# Patient Record
Sex: Female | Born: 1955 | Race: Black or African American | Hispanic: No | Smoking: Never smoker
Health system: Southern US, Community
[De-identification: ages and names within clinical notes are randomized; demographics above are authoritative.]

## PROBLEM LIST (undated history)

## (undated) DIAGNOSIS — F319 Bipolar disorder, unspecified: Secondary | ICD-10-CM

## (undated) DIAGNOSIS — G4730 Sleep apnea, unspecified: Secondary | ICD-10-CM

## (undated) DIAGNOSIS — T884XXA Failed or difficult intubation, initial encounter: Secondary | ICD-10-CM

## (undated) DIAGNOSIS — Z915 Personal history of self-harm: Secondary | ICD-10-CM

## (undated) DIAGNOSIS — M1990 Unspecified osteoarthritis, unspecified site: Secondary | ICD-10-CM

## (undated) DIAGNOSIS — E079 Disorder of thyroid, unspecified: Secondary | ICD-10-CM

## (undated) DIAGNOSIS — I10 Essential (primary) hypertension: Secondary | ICD-10-CM

## (undated) DIAGNOSIS — E039 Hypothyroidism, unspecified: Secondary | ICD-10-CM

## (undated) DIAGNOSIS — J45909 Unspecified asthma, uncomplicated: Secondary | ICD-10-CM

## (undated) DIAGNOSIS — F329 Major depressive disorder, single episode, unspecified: Secondary | ICD-10-CM

## (undated) DIAGNOSIS — F32A Depression, unspecified: Secondary | ICD-10-CM

## (undated) DIAGNOSIS — D649 Anemia, unspecified: Secondary | ICD-10-CM

## (undated) DIAGNOSIS — E669 Obesity, unspecified: Secondary | ICD-10-CM

## (undated) DIAGNOSIS — R200 Anesthesia of skin: Secondary | ICD-10-CM

## (undated) DIAGNOSIS — F840 Autistic disorder: Secondary | ICD-10-CM

## (undated) DIAGNOSIS — R7303 Prediabetes: Secondary | ICD-10-CM

## (undated) DIAGNOSIS — Z9151 Personal history of suicidal behavior: Secondary | ICD-10-CM

## (undated) DIAGNOSIS — J40 Bronchitis, not specified as acute or chronic: Secondary | ICD-10-CM

## (undated) DIAGNOSIS — F419 Anxiety disorder, unspecified: Secondary | ICD-10-CM

## (undated) DIAGNOSIS — Z9109 Other allergy status, other than to drugs and biological substances: Secondary | ICD-10-CM

## (undated) HISTORY — PX: NECK SURGERY: SHX720

## (undated) HISTORY — PX: THYROID SURGERY: SHX805

## (undated) HISTORY — PX: GALLBLADDER SURGERY: SHX652

## (undated) HISTORY — PX: CARPAL TUNNEL RELEASE: SHX101

## (undated) HISTORY — PX: ABDOMINAL HYSTERECTOMY: SHX81

## (undated) HISTORY — PX: THYROIDECTOMY: SHX17

## (undated) HISTORY — PX: CHOLECYSTECTOMY: SHX55

## (undated) HISTORY — DX: Anxiety disorder, unspecified: F41.9

## (undated) HISTORY — PX: COLONOSCOPY: SHX174

## (undated) HISTORY — PX: OOPHORECTOMY: SHX86

## (undated) HISTORY — DX: Disorder of thyroid, unspecified: E07.9

## (undated) HISTORY — DX: Major depressive disorder, single episode, unspecified: F32.9

## (undated) HISTORY — DX: Depression, unspecified: F32.A

## (undated) SURGERY — Surgical Case
Anesthesia: *Unknown

---

## 2004-02-12 ENCOUNTER — Ambulatory Visit: Payer: Self-pay | Admitting: Unknown Physician Specialty

## 2004-04-13 ENCOUNTER — Ambulatory Visit: Payer: Self-pay | Admitting: Unknown Physician Specialty

## 2004-10-12 ENCOUNTER — Ambulatory Visit: Payer: Self-pay | Admitting: Unknown Physician Specialty

## 2004-11-08 ENCOUNTER — Ambulatory Visit: Payer: Self-pay | Admitting: Unknown Physician Specialty

## 2004-11-21 ENCOUNTER — Ambulatory Visit: Payer: Self-pay | Admitting: Internal Medicine

## 2004-12-06 ENCOUNTER — Emergency Department: Payer: Self-pay | Admitting: Emergency Medicine

## 2004-12-13 ENCOUNTER — Emergency Department: Payer: Self-pay | Admitting: Internal Medicine

## 2005-04-06 ENCOUNTER — Ambulatory Visit: Payer: Self-pay | Admitting: Psychiatry

## 2005-05-04 ENCOUNTER — Emergency Department: Payer: Self-pay | Admitting: Emergency Medicine

## 2005-05-04 ENCOUNTER — Other Ambulatory Visit: Payer: Self-pay

## 2005-05-10 ENCOUNTER — Other Ambulatory Visit: Payer: Self-pay

## 2005-05-10 ENCOUNTER — Emergency Department: Payer: Self-pay | Admitting: Emergency Medicine

## 2005-07-11 ENCOUNTER — Other Ambulatory Visit: Payer: Self-pay

## 2005-07-11 ENCOUNTER — Emergency Department: Payer: Self-pay | Admitting: Emergency Medicine

## 2005-11-03 ENCOUNTER — Ambulatory Visit: Payer: Self-pay | Admitting: Unknown Physician Specialty

## 2006-02-07 ENCOUNTER — Ambulatory Visit: Payer: Self-pay | Admitting: Psychiatry

## 2006-02-07 ENCOUNTER — Other Ambulatory Visit (HOSPITAL_COMMUNITY): Admission: RE | Admit: 2006-02-07 | Discharge: 2006-02-21 | Payer: Self-pay | Admitting: Psychiatry

## 2006-03-13 ENCOUNTER — Emergency Department: Payer: Self-pay | Admitting: Unknown Physician Specialty

## 2006-03-13 ENCOUNTER — Other Ambulatory Visit: Payer: Self-pay

## 2006-04-20 ENCOUNTER — Emergency Department: Payer: Self-pay | Admitting: Emergency Medicine

## 2006-06-20 ENCOUNTER — Emergency Department: Payer: Self-pay | Admitting: Emergency Medicine

## 2006-06-20 ENCOUNTER — Other Ambulatory Visit: Payer: Self-pay

## 2006-06-27 ENCOUNTER — Ambulatory Visit: Payer: Self-pay | Admitting: Unknown Physician Specialty

## 2006-07-04 ENCOUNTER — Other Ambulatory Visit: Payer: Self-pay

## 2006-07-04 ENCOUNTER — Emergency Department: Payer: Self-pay | Admitting: Unknown Physician Specialty

## 2006-07-06 ENCOUNTER — Ambulatory Visit: Payer: Self-pay | Admitting: Otolaryngology

## 2006-07-10 ENCOUNTER — Emergency Department: Payer: Self-pay | Admitting: Emergency Medicine

## 2006-08-08 ENCOUNTER — Ambulatory Visit: Payer: Self-pay | Admitting: Unknown Physician Specialty

## 2007-01-03 ENCOUNTER — Emergency Department: Payer: Self-pay | Admitting: Emergency Medicine

## 2007-01-03 ENCOUNTER — Other Ambulatory Visit: Payer: Self-pay

## 2007-02-06 ENCOUNTER — Ambulatory Visit: Payer: Self-pay | Admitting: Gastroenterology

## 2007-09-05 ENCOUNTER — Ambulatory Visit: Payer: Self-pay | Admitting: Unknown Physician Specialty

## 2007-10-09 ENCOUNTER — Ambulatory Visit: Payer: Self-pay | Admitting: Internal Medicine

## 2007-10-09 ENCOUNTER — Observation Stay (HOSPITAL_COMMUNITY): Admission: EM | Admit: 2007-10-09 | Discharge: 2007-10-10 | Payer: Self-pay | Admitting: Emergency Medicine

## 2007-10-30 ENCOUNTER — Ambulatory Visit: Payer: Self-pay | Admitting: Unknown Physician Specialty

## 2008-02-01 ENCOUNTER — Encounter (INDEPENDENT_AMBULATORY_CARE_PROVIDER_SITE_OTHER): Payer: Self-pay | Admitting: Surgery

## 2008-02-01 ENCOUNTER — Ambulatory Visit (HOSPITAL_COMMUNITY): Admission: RE | Admit: 2008-02-01 | Discharge: 2008-02-02 | Payer: Self-pay | Admitting: Surgery

## 2009-03-02 ENCOUNTER — Ambulatory Visit: Payer: Self-pay | Admitting: Unknown Physician Specialty

## 2009-03-31 ENCOUNTER — Ambulatory Visit: Payer: Self-pay | Admitting: Unknown Physician Specialty

## 2009-06-15 ENCOUNTER — Ambulatory Visit (HOSPITAL_COMMUNITY): Admission: RE | Admit: 2009-06-15 | Discharge: 2009-06-16 | Payer: Self-pay | Admitting: Neurosurgery

## 2009-09-28 ENCOUNTER — Emergency Department: Payer: Self-pay | Admitting: Emergency Medicine

## 2010-03-26 ENCOUNTER — Ambulatory Visit: Payer: Self-pay | Admitting: Unknown Physician Specialty

## 2010-04-12 LAB — BASIC METABOLIC PANEL
BUN: 8 mg/dL (ref 6–23)
CO2: 26 mEq/L (ref 19–32)
Calcium: 9.6 mg/dL (ref 8.4–10.5)
Chloride: 105 mEq/L (ref 96–112)
GFR calc Af Amer: >60 mL/min (ref >60)
GFR calc non Af Amer: >60 mL/min (ref >60)
Potassium: 4.3 mEq/L (ref 3.5–5.1)

## 2010-04-12 LAB — CBC
MCHC: 33.9 g/dL (ref 30.0–36.0)
Platelets: 249 10*3/uL (ref 150–400)
RBC: 4.37 MIL/uL (ref 3.87–5.11)
RDW: 14.2 % (ref 11.5–15.5)

## 2010-04-12 LAB — SURGICAL PCR SCREEN: Staphylococcus aureus: POSITIVE — AB

## 2010-05-10 LAB — DIFFERENTIAL
Basophils Absolute: 0 10*3/uL (ref 0.0–0.1)
Basophils Relative: 0 % (ref 0–1)
Eosinophils Absolute: 0.1 10*3/uL (ref 0.0–0.7)
Monocytes Relative: 4 % (ref 3–12)
Neutrophils Relative %: 82 % — ABNORMAL HIGH (ref 43–77)

## 2010-05-10 LAB — COMPREHENSIVE METABOLIC PANEL
ALT: 17 U/L (ref 0–35)
AST: 23 U/L (ref 0–37)
Albumin: 3.7 g/dL (ref 3.5–5.2)
Alkaline Phosphatase: 52 U/L (ref 39–117)
Chloride: 106 mEq/L (ref 96–112)
GFR calc non Af Amer: 58 mL/min — ABNORMAL LOW (ref >60)
Sodium: 141 mEq/L (ref 135–145)

## 2010-05-10 LAB — URINE MICROSCOPIC-ADD ON

## 2010-05-10 LAB — CBC
HCT: 37.7 % (ref 36.0–46.0)
MCHC: 33.8 g/dL (ref 30.0–36.0)
MCV: 93 fL (ref 78.0–100.0)
Platelets: 240 10*3/uL (ref 150–400)
RBC: 4.06 MIL/uL (ref 3.87–5.11)
RDW: 15.6 % — ABNORMAL HIGH (ref 11.5–15.5)
WBC: 5.4 10*3/uL (ref 4.0–10.5)

## 2010-05-10 LAB — URINALYSIS, ROUTINE W REFLEX MICROSCOPIC
Bilirubin Urine: NEGATIVE
Glucose, UA: NEGATIVE mg/dL
Ketones, ur: NEGATIVE mg/dL
Leukocytes, UA: NEGATIVE
Nitrite: NEGATIVE
Specific Gravity, Urine: 1.006 (ref 1.005–1.030)
Urobilinogen, UA: 0.2 mg/dL (ref 0.0–1.0)

## 2010-05-10 LAB — CALCIUM: Calcium: 9.1 mg/dL (ref 8.4–10.5)

## 2010-05-10 LAB — PROTIME-INR: INR: 0.9 (ref 0.00–1.49)

## 2010-06-08 NOTE — Discharge Summary (Signed)
Marissa Quinn                 ACCOUNT NO.:  [id number]   MEDICAL RECORD NO.:  [id number]          PATIENT TYPE:  OBV   LOCATION:  3705                         FACILITY:  [facility]   PHYSICIAN:  Alvester Morin, [name]  DATE OF BIRTH:  1955-06-09   DATE OF ADMISSION:  10/09/2007  DATE OF DISCHARGE:  10/10/2007                               DISCHARGE SUMMARY   DISCHARGE DIAGNOSES:  1. Chest pain.  2. Shortness of breath.  3. Hypertension.  4. Depression.  5. Thyroid goiter with secondary dysphagia.  6. Obesity.  7. Sleep apnea.  8. Asperger syndrome.  9. Bilateral carpal tunnel syndrome status post bilateral median nerve      decompression 6 years ago.  10.Acute cholecystitis secondary to obstruction status post      cholecystectomy 6 years ago.   ALLERGIES:  TETRACYCLINE and RISPERDAL.   Discharge medications are as follows:  1. Abilify 20 mg 1 tablet by mouth at bedtime.  2. Celexa 60 mg 1 tablet by mouth at bedtime.  3. Hydrochlorothiazide 25 mg 1 tablet by mouth daily.  4. Enalapril 10 mg 1 tablet by mouth daily.   The patient will be discharged home to follow up with primary care  Charlisa Cham, Dr. Silver Huguenin on October 18, 2007 for annual physical  scheduled prior to admission with instructions to follow up on hospital  stay and for continued workup if any for chest pain with dyspnea on  exertion.   Procedures: None performed   Marissa Quinn is a 55 year old African American female who presented to the  emergency room at [facility] with 3-4 days of  intermittent 3/10 substernal chest pain radiating to bilateral arms with  associated dizziness, headache, and shortness of breath.  She states  that the pain increased with exercise and was relieved with rest.  She  denied having any symptoms similar to this previously.  Denied any  tingling, numbness, or weakness in any of her extremities.  She also  complained of some nausea and general malaise, but  denied vomiting  admitted to 1 episode of diarrhea.  She denied diaphoresis, but admit to  feeling flushed with the onset of chest pain.  She had no other  complaints or concerns at this time.   Admitting labs were as follows:  Vital signs; Temperature 98.2, blood  pressure 146/74, heart rate 68, respiratory rate 16, pulse ox 98% on  room air.  Hemoglobin and hematocrit were 13.3 and 39.0 respectively.  Cardiac enzymes obtained in the emergency room showed CK-MB at 1.4, 1.2,  and 1.7.  Troponin remained under 0.05 through the entire series of  enzyme studies.  Basic metabolic panel shows sodium of 138, potassium of  3.6, chloride of 103, CO2 of 26, BUN of 7, creatinine of 1.1, and  glucose of 85.   EKG normal   CXR: peribronchial thickened of bronchitis vs chronic lung disease.   Hospital course was as follows:  1. Chest pain.  The patient was admitted for observation and placed on      telemetry.  Two additional EKGs  were obtained all showing normal      sinus rhythm with no abnormalities over the course of stay.      Cardiac enzymes were repeated and remained within normal limits.      Lipase was normal and TSH was normal.  The patient remained      asymptomatic with pain well controlled with Tylenol p.r.n.      Additionally, she was placed on Crestor 20 mg p.o. daily.  She      subsequently developed some cramping leg pain and her creatine      kinase increased slightly to 210.  At this point, the Crestor was      discontinued and she will not be sent home on any statins.  It was      felt like she could be safely discharged with followup with her      primary Areeb Corron because this was not likely cardiac in etiology.  2. Shortness of breath.  Chest x-ray was obtained while on the      emergency room, which showed some bilateral bronchial and      interstitial prominences.  Radiology called this chronic lung      disease versus bronchitis.  D-dimer was also obtained, which was       mildly elevated at 0.89.  It was not felt that the patient's      symptoms were related to possible pulmonary embolism; however, 40      mg of Lovenox was started and she was placed on DVT prophylaxis.  3. Hypertension.  The patient's blood pressure remained stable      throughout the course of her admission.  4. Depression.  Abilify and Celexa were continued.  5. Thyroid goiter.  TSH was checked and found to be within normal      limits at 0.626.  The patient is scheduled for thyroidectomy in      December 2009.  6. Obesity.  The patient was placed on heart-healthy diet and      discussed the importance of exercise.   The patient's labs on discharge are as follows:  Temperature 98.4,  respirations 18, blood pressure 128/76, O2 saturation 99% on room air,  heart rate 67.  White blood count 7.5, hemoglobin 12.3, hematocrit 37.2,  platelets 318.  Sodium 136, potassium 3.6, chloride 104, CO2 of 23, BUN  8, creatinine 0.76, glucose 74.   Dictation by Nelda Bucks, MS IV for Joaquin Courts, MD.      Joaquin Courts, MD  Electronically Signed      Alvester Morin, [name]  Electronically Signed    VW/MEDQ  D:  10/10/2007  T:  10/11/2007  Job:  [id number]   cc:   Yetta Flock

## 2010-06-08 NOTE — Op Note (Signed)
Marissa Quinn, Marissa Quinn                 ACCOUNT NO.:  [id number]   MEDICAL RECORD NO.:  [id number]          PATIENT TYPE:  AMB   LOCATION:  DAY                          FACILITY:  [facility]   PHYSICIAN:  Velora Heckler, MD      DATE OF BIRTH:  Jan 16, 1956   DATE OF PROCEDURE:  02/01/2008  DATE OF DISCHARGE:                               OPERATIVE REPORT   PREOPERATIVE DIAGNOSIS:  Thyroid goiter with compressive symptoms.   POSTOPERATIVE DIAGNOSIS:  Thyroid goiter with compressive symptoms.   PROCEDURE:  Total thyroidectomy.   SURGEON:  Velora Heckler, MD, FACS   ASSISTANT:  Bertram Savin, MD   ANESTHESIA:  General per Dr. Lestine Box.   ESTIMATED BLOOD LOSS:  Minimal.   PREPARATION:  Betadine.   COMPLICATIONS:  None.   INDICATIONS:  The patient is a 55 year old black female from [place],  [place].  She has a longstanding history of thyroid goiter.  Over  the past 2 years she has had a significant increase in the size of the  left thyroid lobe.  She has developed mild dysphagia.  The patient now  comes to surgery on referral from her endocrinologist for thyroidectomy.   DESCRIPTION OF PROCEDURE:  The procedure is done in OR #1 at the [facility].  The patient is brought to the operating room,  placed in a supine position on the operating room table.  Following  administration of general anesthesia, the patient is positioned and then  prepped and draped in the usual strict aseptic fashion.  After  ascertaining that an adequate level of anesthesia had been achieved, a  Kocher incision is made with a #15 blade.  Dissection is carried through  subcutaneous tissues and platysma.  Hemostasis is obtained with  electrocautery.  Strap muscles are incised in the midline.  The trachea  is slightly deviated to the right.  Dissection is carried down to the  isthmus of the thyroid in the airway.  Subplatysmal flaps are elevated  cephalad and caudad from the thyroid  notch to the sternal notch.  A  Mahorner self-retaining retractor is placed for exposure.  Dissection is  begun on the left side.  Strap muscles are elevated on the left and the  left thyroid lobe is exposed.  The left lobe is gently dissected out  with blunt dissection.  The superior pole vessels are divided between  medium Ligaclips with the Harmonic scalpel.  The gland was mobilized and  rotated anteriorly.  Inferior venous tributaries are divided between  medium Ligaclips with the Harmonic scalpel.  The superior parathyroid  tissue is identified and preserved.  Branches of the inferior thyroid  artery are divided between small Ligaclips with the Harmonic scalpel.  The recurrent laryngeal nerve is identified and preserved.  The inferior  parathyroid gland is identified and preserved on its vascular pedicle.  The gland is mobilized after transection of the ligament of Berry and  the isthmus is mobilized across the midline.  There is no significant  pyramidal lobe.  A dry pack is placed in the  left neck.   Next we turn our attention to the right thyroid lobe.  Again, the strap  muscles are reflected laterally and the right lobe is exposed.  The  superior pole vessels are dissected out and divided between medium  Ligaclips with the Harmonic scalpel.  The gland is rolled further  anteriorly.  The inferior parathyroid gland on the right is identified  on the capsule of the gland.  It is gently dissected off and mobilized  on its vascular pedicle and preserved.  The inferior venous tributaries  are divided between small and medium Ligaclips with the Harmonic  scalpel.  The gland is rotated anteriorly.  Branches of the inferior  thyroid artery are divided between small Ligaclips.  The ligament of  Allyson Sabal is transected with electrocautery and the gland is mobilized up  and onto the anterior trachea.  It is excised off of the trachea.  A  suture is used to mark the left superior pole.  The  entire thyroid gland  is submitted to pathology for review.  The neck is irrigated with warm  saline, which is evacuated.  Good hemostasis is achieved bilaterally.  Surgicel is placed in the operative field.  The strap muscles are  reapproximated in the midline with interrupted 3-0 Vicryl sutures.  The  platysma is closed with interrupted 3-0 Vicryl sutures.  The skin is  closed with a running 4-0 Monocryl subcuticular suture.  The wound is  washed and dried and benzoin and Steri-Strips are applied.  Sterile  dressings are applied.  The patient is awakened from anesthesia and  brought to the recovery room in stable condition.  The patient tolerated  the procedure well.      Velora Heckler, MD  Electronically Signed     TMG/MEDQ  D:  02/01/2008  T:  02/01/2008  Job:  [id number]   cc:   Rockie Neighbours, MD  [place], [place]   Velora Heckler, MD  [phone] [place]  [place] [address]

## 2010-10-25 LAB — CK TOTAL AND CKMB (NOT AT ARMC)
CK, MB: 1.5
CK, MB: 1.7
Relative Index: 0.7
Relative Index: 1.2
Total CK: 140

## 2010-10-25 LAB — DIFFERENTIAL
Basophils Absolute: 0
Basophils Relative: 0
Eosinophils Absolute: 0.3
Eosinophils Relative: 4
Lymphocytes Relative: 4 — ABNORMAL LOW
Neutro Abs: 6.5
Neutrophils Relative %: 86 — ABNORMAL HIGH

## 2010-10-25 LAB — POCT I-STAT, CHEM 8
BUN: 7
Chloride: 103
Glucose, Bld: 85
Hemoglobin: 13.3

## 2010-10-25 LAB — BASIC METABOLIC PANEL
Calcium: 8.6
GFR calc Af Amer: >60
GFR calc non Af Amer: >60
Glucose, Bld: 74
Potassium: 3.6
Sodium: 136

## 2010-10-25 LAB — CBC
HCT: 35.6 — ABNORMAL LOW
Hemoglobin: 11.9 — ABNORMAL LOW
MCHC: 32.9
Platelets: 318
RBC: 3.74 — ABNORMAL LOW
RBC: 3.9
RDW: 13.8
WBC: 6.6
WBC: 7.5

## 2010-10-25 LAB — POCT CARDIAC MARKERS
CKMB, poc: 1.2
Myoglobin, poc: 59.4
Troponin i, poc: <0.05

## 2010-10-25 LAB — LIPID PANEL
Cholesterol: 212 — ABNORMAL HIGH
HDL: 70
Total CHOL/HDL Ratio: 3
VLDL: 9

## 2010-10-25 LAB — COMPREHENSIVE METABOLIC PANEL
AST: 25
Calcium: 9
Chloride: 102
Creatinine, Ser: 0.85
GFR calc non Af Amer: >60

## 2010-10-25 LAB — TROPONIN I: Troponin I: 0.03

## 2011-04-20 ENCOUNTER — Ambulatory Visit: Payer: Self-pay | Admitting: Unknown Physician Specialty

## 2011-07-06 ENCOUNTER — Ambulatory Visit: Payer: Self-pay | Admitting: Unknown Physician Specialty

## 2012-05-22 ENCOUNTER — Ambulatory Visit: Payer: Self-pay | Admitting: Unknown Physician Specialty

## 2012-05-22 DIAGNOSIS — F32A Depression, unspecified: Secondary | ICD-10-CM | POA: Insufficient documentation

## 2012-05-22 DIAGNOSIS — F329 Major depressive disorder, single episode, unspecified: Secondary | ICD-10-CM | POA: Insufficient documentation

## 2012-09-27 ENCOUNTER — Ambulatory Visit: Payer: Self-pay | Admitting: Gastroenterology

## 2013-04-03 ENCOUNTER — Emergency Department: Payer: Self-pay | Admitting: Emergency Medicine

## 2013-04-03 LAB — BASIC METABOLIC PANEL
Anion Gap: 3 — ABNORMAL LOW (ref 7–16)
BUN: 17 mg/dL (ref 7–18)
CHLORIDE: 107 mmol/L (ref 98–107)
CREATININE: 0.98 mg/dL (ref 0.60–1.30)
Calcium, Total: 9.1 mg/dL (ref 8.5–10.1)
Co2: 29 mmol/L (ref 21–32)
EGFR (Non-African Amer.): >60
Glucose: 82 mg/dL (ref 65–99)
OSMOLALITY: 278 (ref 275–301)
Potassium: 3.8 mmol/L (ref 3.5–5.1)
Sodium: 139 mmol/L (ref 136–145)

## 2013-04-03 LAB — CBC
HCT: 39.5 % (ref 35.0–47.0)
HGB: 12.6 g/dL (ref 12.0–16.0)
MCH: 29.5 pg (ref 26.0–34.0)
MCHC: 32 g/dL (ref 32.0–36.0)
MCV: 92 fL (ref 80–100)
Platelet: 288 10*3/uL (ref 150–440)
RBC: 4.28 10*6/uL (ref 3.80–5.20)
RDW: 14 % (ref 11.5–14.5)
WBC: 5.9 10*3/uL (ref 3.6–11.0)

## 2013-04-03 LAB — PRO B NATRIURETIC PEPTIDE: B-TYPE NATIURETIC PEPTID: 9 pg/mL (ref 0–125)

## 2013-04-03 LAB — TROPONIN I

## 2013-07-18 DIAGNOSIS — E039 Hypothyroidism, unspecified: Secondary | ICD-10-CM | POA: Insufficient documentation

## 2013-08-21 ENCOUNTER — Ambulatory Visit: Payer: Self-pay | Admitting: Internal Medicine

## 2013-11-21 DIAGNOSIS — IMO0001 Reserved for inherently not codable concepts without codable children: Secondary | ICD-10-CM | POA: Insufficient documentation

## 2013-11-21 DIAGNOSIS — R6889 Other general symptoms and signs: Secondary | ICD-10-CM | POA: Insufficient documentation

## 2013-11-21 DIAGNOSIS — E668 Other obesity: Secondary | ICD-10-CM | POA: Insufficient documentation

## 2013-11-21 DIAGNOSIS — R200 Anesthesia of skin: Secondary | ICD-10-CM | POA: Insufficient documentation

## 2013-12-02 ENCOUNTER — Ambulatory Visit: Payer: Self-pay | Admitting: Neurology

## 2014-02-26 DIAGNOSIS — I10 Essential (primary) hypertension: Secondary | ICD-10-CM | POA: Insufficient documentation

## 2014-02-26 DIAGNOSIS — M5412 Radiculopathy, cervical region: Secondary | ICD-10-CM | POA: Insufficient documentation

## 2014-04-21 ENCOUNTER — Ambulatory Visit: Payer: Self-pay | Admitting: Dietician

## 2014-05-05 ENCOUNTER — Ambulatory Visit
Admit: 2014-05-05 | Disposition: A | Discharge status: Home / Self Care | Payer: Self-pay | Attending: Psychiatry | Admitting: Psychiatry

## 2014-05-05 LAB — LIPID PANEL
Cholesterol: 207 mg/dL — ABNORMAL HIGH
HDL: 67 mg/dL
LDL CHOLESTEROL, CALC: 126 mg/dL — AB
Triglycerides: 68 mg/dL
VLDL CHOLESTEROL, CALC: 14 mg/dL

## 2014-05-05 LAB — GLUCOSE, RANDOM: GLUCOSE: 96 mg/dL

## 2014-05-14 ENCOUNTER — Ambulatory Visit: Admit: 2014-05-14 | Disposition: A | Discharge status: Home / Self Care | Payer: Self-pay | Admitting: Internal Medicine

## 2014-05-16 DIAGNOSIS — Z8669 Personal history of other diseases of the nervous system and sense organs: Secondary | ICD-10-CM | POA: Insufficient documentation

## 2014-05-16 DIAGNOSIS — G4730 Sleep apnea, unspecified: Secondary | ICD-10-CM | POA: Insufficient documentation

## 2014-05-16 DIAGNOSIS — Z8639 Personal history of other endocrine, nutritional and metabolic disease: Secondary | ICD-10-CM | POA: Insufficient documentation

## 2014-05-16 DIAGNOSIS — Z8679 Personal history of other diseases of the circulatory system: Secondary | ICD-10-CM | POA: Insufficient documentation

## 2014-06-05 DIAGNOSIS — G4733 Obstructive sleep apnea (adult) (pediatric): Secondary | ICD-10-CM | POA: Insufficient documentation

## 2014-06-13 ENCOUNTER — Ambulatory Visit: Payer: Medicare Other | Attending: Neurology

## 2014-06-13 DIAGNOSIS — R0683 Snoring: Secondary | ICD-10-CM | POA: Diagnosis present

## 2014-06-13 DIAGNOSIS — G4733 Obstructive sleep apnea (adult) (pediatric): Secondary | ICD-10-CM | POA: Insufficient documentation

## 2014-06-30 ENCOUNTER — Other Ambulatory Visit: Payer: Self-pay

## 2014-06-30 DIAGNOSIS — F4310 Post-traumatic stress disorder, unspecified: Secondary | ICD-10-CM | POA: Insufficient documentation

## 2014-06-30 DIAGNOSIS — G4700 Insomnia, unspecified: Secondary | ICD-10-CM | POA: Insufficient documentation

## 2014-06-30 DIAGNOSIS — F840 Autistic disorder: Secondary | ICD-10-CM | POA: Insufficient documentation

## 2014-06-30 DIAGNOSIS — F411 Generalized anxiety disorder: Secondary | ICD-10-CM | POA: Insufficient documentation

## 2014-06-30 DIAGNOSIS — F32A Depression, unspecified: Secondary | ICD-10-CM | POA: Insufficient documentation

## 2014-06-30 DIAGNOSIS — T1491XA Suicide attempt, initial encounter: Secondary | ICD-10-CM | POA: Insufficient documentation

## 2014-06-30 DIAGNOSIS — F3342 Major depressive disorder, recurrent, in full remission: Secondary | ICD-10-CM | POA: Insufficient documentation

## 2014-06-30 DIAGNOSIS — F329 Major depressive disorder, single episode, unspecified: Secondary | ICD-10-CM | POA: Insufficient documentation

## 2014-06-30 DIAGNOSIS — F331 Major depressive disorder, recurrent, moderate: Secondary | ICD-10-CM | POA: Insufficient documentation

## 2014-07-01 ENCOUNTER — Other Ambulatory Visit: Payer: Self-pay

## 2014-07-01 ENCOUNTER — Ambulatory Visit (INDEPENDENT_AMBULATORY_CARE_PROVIDER_SITE_OTHER): Payer: Medicare Other | Admitting: Psychiatry

## 2014-07-01 ENCOUNTER — Encounter: Payer: Self-pay | Admitting: Psychiatry

## 2014-07-01 VITALS — BP 118/78 | HR 60 | Temp 97.8°F

## 2014-07-01 DIAGNOSIS — F331 Major depressive disorder, recurrent, moderate: Secondary | ICD-10-CM

## 2014-07-01 DIAGNOSIS — F840 Autistic disorder: Secondary | ICD-10-CM

## 2014-07-01 MED ORDER — CITALOPRAM HYDROBROMIDE 40 MG PO TABS: 40.0000 mg | ORAL_TABLET | Freq: Every day | ORAL | Status: DC

## 2014-07-01 MED ORDER — ARIPIPRAZOLE 15 MG PO TABS: 15.0000 mg | ORAL_TABLET | Freq: Every day | ORAL | Status: DC

## 2014-07-01 NOTE — Progress Notes (Signed)
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 She does not have any complaints today. She continues on her current medications. She states that she started to take the Abilify at night because she thought it might be making her sleepy. However patient is also been diagnosed with sleep apnea and has started using a CPAP machine since her last visit. Patient indicates she feels more rested in the daytime since the use of the CPAP.  She continues to have her part-time job which is daily from the morning until early afternoon. She states she also has been going to the gym trying to lose weight. However she states she has not been successful in losing weight. We reviewed her metabolic labs and they were generally pretty good. Her total cholesterol was slightly elevated at 207, LDL was elevated at 125. Her blood glucose was normal at 96. Chief Complaint:  Chief Complaint    Depression; Other     Visit Diagnosis:  No diagnosis found.  Past Medical History:  Past Medical History  Diagnosis Date  . Anxiety   . Depression   . Thyroid disease     Past Surgical History  Procedure Laterality Date  . Thyroidectomy      total  . Thyroid surgery    . Gallbladder surgery    . Neck surgery     Family History:  Family History  Problem Relation Age of Onset  . Brain cancer Mother   . Hypertension Brother   . Depression Paternal Grandmother   . Dementia Father   . Alcohol abuse Father   . Depression Father   . Depression Daughter    Social History:  History   Social History  . Marital Status: Divorced    Spouse Name: N/A  . Number of Children: N/A  . Years of Education: N/A   Social History Main Topics  . Smoking status: Never Smoker   . Smokeless tobacco: Never Used  . Alcohol Use: No     Comment: Occasionally   . Drug Use:  No  . Sexual Activity: No   Other Topics Concern  . None   Social History Narrative   Additional History:   Assessment:   Musculoskeletal: Strength & Muscle Tone: within normal limits Gait & Station: normal Patient leans: N/A  Psychiatric Specialty Exam: HPI  Review of Systems  Psychiatric/Behavioral: Negative for depression, suicidal ideas, hallucinations, memory loss and substance abuse. The patient is not nervous/anxious and does not have insomnia.     Blood pressure 118/78, pulse 60, temperature 97.8 F (36.6 C), temperature source Tympanic, SpO2 94 %.There is no weight on file to calculate BMI.  General Appearance: Well Groomed  Eye Contact:  Good  Speech:  Clear and Coherent and Slow  Volume:  Normal  Mood:  Good  Affect:  Blunt  Thought Process:  Generally appropriate/linear but slightly impoverished  Orientation:  Full (Time, Place, and Person)  Thought Content:  Negative  Suicidal Thoughts:  No  Homicidal Thoughts:  No  Memory:  Immediate;   Good Recent;   Good Remote;   Good  Judgement:  Good  Insight:  Fair  Psychomotor Activity:  Negative  Concentration:  Fair  Recall:  Good  Fund of Knowledge: Fair  Language: Good  Akathisia:  Negative  Handed:  Right unknown  AIMS (if indicated):  Normal  Assets:  Desire for Improvement  ADL's:  Intact  Cognition: WNL  Sleep:  Five hours per night, uses CPAP   Is the patient at risk to self?  No. Has the patient been a risk to self in the past 6 months?  No. Has the patient been a risk to self within the distant past?  No. Is the patient a risk to others?  No. Has the patient been a risk to others in the past 6 months?  No. Has the patient been a risk to others within the distant past?  No.  Current Medications: Current Outpatient Prescriptions  Medication Sig Dispense Refill  . albuterol (PROVENTIL HFA;VENTOLIN HFA) 108 (90 BASE) MCG/ACT inhaler Inhale into the lungs.    . ARIPiprazole (ABILIFY) 15 MG  tablet Take 1 tablet by mouth daily.    Marland Kitchen aspirin EC 81 MG tablet Take by mouth.    . citalopram (CELEXA) 40 MG tablet Take 1 tablet by mouth daily.    . fluticasone (FLONASE) 50 MCG/ACT nasal spray Place into the nose.    . hydrochlorothiazide (HYDRODIURIL) 12.5 MG tablet Take by mouth.    . levothyroxine (SYNTHROID, LEVOTHROID) 112 MCG tablet Take by mouth daily.    . Multiple Vitamin (MULTI-VITAMINS) TABS Take by mouth.    Marland Kitchen amoxicillin-clavulanate (AUGMENTIN) 875-125 MG per tablet Take 1 tablet by mouth every 12 (twelve) hours.  0  . Cholecalciferol 1000 UNITS tablet Take by mouth.    . gabapentin (NEURONTIN) 100 MG capsule Take by mouth.    . hydrochlorothiazide (HYDRODIURIL) 25 MG tablet Take by mouth.    . levocetirizine (XYZAL) 5 MG tablet Take by mouth.    . pyridOXINE (VITAMIN B-6) 100 MG tablet Take 300 mg by mouth.    . traZODone (DESYREL) 50 MG tablet Take 50 mg by mouth.     No current facility-administered medications for this visit.    Medical Decision Making:  Established Problem, Stable/Improving (1)  Treatment Plan Summary:Medication management Patient has been stable on this medication regimen for some time. We will continue her citalopram at 40 mg daily. She'll continue her Abilify 15 mg daily. Patient will follow up in 3 months. She's been encouraged call if questions or concerns prior to next appointment.  Faith Rogue 07/01/2014, 1:56 PM

## 2014-07-02 ENCOUNTER — Encounter: Payer: Self-pay | Admitting: Licensed Clinical Social Worker

## 2014-07-02 NOTE — Progress Notes (Signed)
### [employer] LCSW Progress Note:    Marissa Quinn 06/05/2014 1:15 PM Location: [place] Patient #: [id number] DOB: [birth year] Divorced / Language: [place] / Race: Black or African American Female    History of Present Illness(Marissa Quinn N Marissa Quinn; 06/11/2014 11:36 AM) The patient is a 59 year old female who presents for a recheck of Depression. The patient describes this as mild and resolved. Note for "Depression": SW discussed case in detail for transition to another provider in the practice. LCSW is availabe for any questions before her last day.  Additional reason for visit:  Recheck of Post traumatic stress disorderis described as the following: The patient describes this as mild.  Note: Start Time: 1:15 p.m. End Time: 2:10 p.m.  Marissa Quinn visited with her daughter and spent time in [place]. Returned Wednesday on [employer]tra[employer]. "I enjoyed it." [place] referring to time spent over Mother's Day with daughter. "I got off the check list." She missed doing her exercise but walked around  On father's birthday which was May 28, 2014, she brought cookies and brownies to the staff at the [facility] where her father died. She's planning to go back on Father's Day. "I spent my daddy's birthday giving back to [facility]." Is planning to return here on Father's Day. Looking forward to the [place] family reunion on June 18.  She has been back on a regular schedule and reports sleeping as good. No changes in her moods. Feels that she is slowly losing weight and is working on her writing around her living with depression, suicide and recovery. LCSW provided her with web resources for writing communities. Enjoys reading as well and finds reading out loud helps her to reflect and understand more of what the content.  Continues to see Marissa Quinn with [facility] for bereavement counseling. Denied  concerns in this area.  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 She recently met with her Peer Support Specialist. Marissa Quinn also is enjoying her private crochett classes. In an effort to meet up with a work out/body building partner, Marissa Quinn created a profile on Meet [email or web address] So far has not had any responses. To get more exercise she is working with a walking group at her [employer].  Will see her PCP today to have results of her Sleep Study. She has a history of being diagnosed with Sleep Apnea but she could not tolerate the CPAP.  Her brother will come in town next week to meet with the attorney since he is the Executor of the Will. He will stay with her yet she stated "I don't think that it will be too stressful."  No reported distress or new stressors and Marissa Quinn continues to feel that she is stable. Commended client on maintaining a daily routine/structure while including activities that she enjoys along with introducing herself to new activities.  Supportive therapy provided along with much reassurance and emotional support. Encouraged ongoing expression of feelings, thoughts, concerns and needs.  PLAN: Continue to monitor client's symptoms and changes. Follow up per client's request in two weeks.    Medication History(Marissa Quinn N Marissa Quinn; 06/05/2014 1:26 PM) Citalopram Hydrobromide (40MG Tablet, 1 (one) Oral daily, Taken starting 05/01/2014) Active. Abilify (15MG Tablet, 1 (one) Oral daily, Taken starting 05/01/2014) Active. Synthroid (112MCG Tablet, Oral daily) Active. Hydrochlorothiazide (25MG Tablet, Oral daily) Active. Aspirin (81MG Tablet, Oral daily) Active. Vitamin D3 (1000UNIT Tablet, Oral daily) Active. Multiple Vitamin (1 (one) Oral daily) Active.    Review of  Systems(Marissa Quinn N Marissa Quinn; 06/05/2014 2:02 PM) Psychiatric:Not Present- Anxiety, Attention Deficit Disorder, Change in Sleep Pattern (Not waking up as much as she did previously.),  Decrease attention, Decrease concentration, Delirium, Delusions, Depression ("I don't think I have any. I think I'm okay so far." "It's not as sunny as I would like."), Frequent crying, Hallucinations, Inability to Concentrate, Insomnia, Memory Loss, Nervousness, Suicidal Ideation and Suicidal Planning.    Assessment & Plan(Marissa Quinn N Marissa Quinn; 06/05/2014 1:32 PM) Recurrent major depression in full remission (296.36  F33.42) Current Plans l Short Term Goal: Patient will be Able to Communicate Needs or Concerns  l Short Term Goal: Will continue using coping strategies that support mood stability and daily functioning.  l Intervention: Individual Psychotherapy  l Interventions: Cognitive Behavioral Therapy   Autistic spectrum disorder (299.00  F84.0) Story: Patient describes functioning well and enjoying life. Thus appears this is stable. Current Plans l Short Term Goal: Identify Positives in their Life  l Intervention: Stress Management  l Interventions: Cognitive Behavioral Therapy  l Intervention: Individual Psychotherapy  l Level of Participation: Interactive  l Patient Strength: Optimistic that Change can Occur  l Patient Strength: Motivated for Treatment  l Patient Strength: Able to Set Goals  l Patient Strength: Managing Daily Responsibilities  l Patient Strength: Average Intellecutal Ability  l Patient Strength: Family involvement or Support  l Patient Strength: History of Previous Success in Treatment  l Patient Strength: Financial Resources Available  l Patient Strength: Verbal  l Patient Strength: Outside Hobbies/Interest  l Patient Strength: Cultural/Spiritual and Community Support/Involvement  l Patient Strength: Stable Housing  l Patient Strength: Employed  l Patient Strength: Aware of Need for Medications  l INDIVIDUAL  PSYCHOTHERAPY FOR 45 TO 50 MINUTES (46219) l Follow up in 2 weeks or as needed    Signed electronically by Marissa Quinn Marissa Quinn (06/11/2014 11:36 AM)
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
## 2014-07-03 ENCOUNTER — Ambulatory Visit (INDEPENDENT_AMBULATORY_CARE_PROVIDER_SITE_OTHER): Payer: Medicare Other | Admitting: Licensed Clinical Social Worker

## 2014-07-03 DIAGNOSIS — F3341 Major depressive disorder, recurrent, in partial remission: Secondary | ICD-10-CM

## 2014-07-03 DIAGNOSIS — F840 Autistic disorder: Secondary | ICD-10-CM

## 2014-07-03 DIAGNOSIS — F4310 Post-traumatic stress disorder, unspecified: Secondary | ICD-10-CM

## 2014-07-03 NOTE — Progress Notes (Signed)
THERAPIST PROGRESS NOTE  Session Time: 1:05 p.m.  Participation Level: Active  Behavioral Response: NeatAlertEuthymic  Type of Therapy: Individual Therapy  Treatment Goals addressed: Coping  Interventions: Solution Focused, Strength-based and Supportive  Summary: Marissa Quinn is a 59 y.o. female who presents with depression, some anxiety related to PTSD symptoms who overall sees herself as making progress.  Her eye contact was good, she seemed a bit slowed down both in speech and psychomotor yet no evidence of tangential thinking.  No reported mania symptoms or agitation.   Sleep has improved with use of over the nose c-pap machine and is adjusting to it.  "I don't feel as sleepy during the day and feeling more rested and more alert than I use to be."  Other forms of self care is joining a gym that she attended over 54 years ago and old members still there and are very supportive.  "It's good Christian atmosphere."  The knitting has been stopped so she could spend more time exercising.  "Everything is going just fine."  "Mood has been good. It's just coming up on my first father's day. I'm looking forward to it. I guess I'm grieving." Visited father's grave last week and the head stone was placed.  Another event is the Family reunion where client always went to take her father. Her daughter is driving up from [facility] and Marissa Quinn may actually ride home to [facility] with daughter a few days.  She talked aout  Only complaint today is recent increase in some allergy symptoms but overall denied additional medical or emotional symptoms.  Work is going good and she really feels supported by co-workers and she enjoys spending time at [employer] and attending Bible Study classes.  Her ongoing goals include losing weight and getting healthy and she is planning to attend Weight Watchers and the other goal is to write a book. "It's coming along in terms of her book."  When the topic becomes too heavy and she feels sad  or depressed but recognizes when she needs to stop this and not keep writing.  She continues to keep a schedule and mood tracker most days yet has given self permission to not become overly obsessive with this.  She did not bring this to therapy today as she did not feel the need to do so.  Maleeah denied additional or new concerns related to therapy.  Given her progress and recent medication follow up with last week with Dr. Jimmye Norman, Ms.Laurance Flatten and LCSW discussed future needs/topics for therapy.  She appeared well an described self as feeling confident in terms of coping strategies available to her.  Decision per client to return to OPT PRN because she is feeling much more stable.   Suicidal/Homicidal: Negativewithout intent/plan  Therapist Response:    Offered ongoing emotional and social support to continue to build trust and rapport.  Gently reinforced client's available strengths and resiliency factors and commended for keeping self busy with activities that she is passionate about, addressing her     spiritual needs through [place] and bibe study, using exercise to increase health and staying connected with people that who are positive, supportive and encouraging.  Gently reminded to continue strategies that will allow her to recognize any patterns in     thoughts or situations that precipitate a change in mood.  Commended Marissa Quinn on her dedication/motivation to use effective boundary setting and coping skills to promote ongoing mental health stability.  Plan: Return again in 3 months or  PRN  Diagnosis: Major Depressive Disorder, Recurrent, In Partial Remission   PTSD   Autism  Miguel Dibble, LCSW 07/03/2014

## 2014-10-01 ENCOUNTER — Ambulatory Visit (INDEPENDENT_AMBULATORY_CARE_PROVIDER_SITE_OTHER): Payer: Medicare Other | Admitting: Psychiatry

## 2014-10-01 ENCOUNTER — Encounter: Payer: Self-pay | Admitting: Psychiatry

## 2014-10-01 VITALS — BP 118/78 | HR 72 | Temp 98.0°F | Ht 64.0 in | Wt 258.4 lb

## 2014-10-01 DIAGNOSIS — F331 Major depressive disorder, recurrent, moderate: Secondary | ICD-10-CM | POA: Diagnosis not present

## 2014-10-01 MED ORDER — CITALOPRAM HYDROBROMIDE 40 MG PO TABS: 40.0000 mg | ORAL_TABLET | Freq: Every day | ORAL | Status: DC

## 2014-10-01 MED ORDER — BUPROPION HCL ER (XL) 150 MG PO TB24: 150.0000 mg | ORAL_TABLET | Freq: Every day | ORAL | Status: DC

## 2014-10-01 MED ORDER — ARIPIPRAZOLE 15 MG PO TABS: 15.0000 mg | ORAL_TABLET | Freq: Every day | ORAL | Status: DC

## 2014-10-01 NOTE — Progress Notes (Signed)
BH MD/PA/NP OP Progress Note  10/01/2014 2:07 PM Marissa Quinn  MRN:  [id number]  Subjective:  Patient returns for follow-up for major depressive disorder, moderate, recurrent in autistic spectrum disorder. Today she does discuss that when the fall arrives her mood becomes more depressed. She states that this occurs because she has her birthday in September and it makes her think about living another year. She states she does not have any suicidal thoughts but sometimes does think about making another year and dealing with her depression. However she states she does want to continue to live for her daughter who is in medical school and be able to support her. She also states that her father passed away in 12-28-22 and thus as we get close to the holidays she feels like she will be sad because she will have to go through this time without him. He was a comforting source for her. It's her appetite is good and she is sleeping fairly well.  His cussed this and she reports her mood is somewhat more depressed in the fall that maybe we can initiate some Wellbutrin to augment her Celexa. Patient is agreeable to this plan. She continues to work and her job but does report some decreased energy at times. Chief Complaint: As the days get shorter, my mood Chief Complaint    Follow-up; Medication Refill     Visit Diagnosis:     ICD-9-CM ICD-10-CM   1. Major depressive disorder, recurrent episode, moderate 296.32 F33.1 ARIPiprazole (ABILIFY) 15 MG tablet     citalopram (CELEXA) 40 MG tablet    Past Medical History:  Past Medical History  Diagnosis Date  . Anxiety   . Depression   . Thyroid disease     Past Surgical History  Procedure Laterality Date  . Thyroidectomy      total  . Thyroid surgery    . Gallbladder surgery    . Neck surgery     Family History:  Family History  Problem Relation Age of Onset  . Brain cancer Mother   . Hypertension Brother   . Depression Paternal Grandmother   .  Dementia Father   . Alcohol abuse Father   . Depression Father   . Depression Daughter    Social History:  Social History   Social History  . Marital Status: Divorced    Spouse Name: N/A  . Number of Children: N/A  . Years of Education: N/A   Social History Main Topics  . Smoking status: Never Smoker   . Smokeless tobacco: Never Used  . Alcohol Use: No     Comment: Occasionally   . Drug Use: No  . Sexual Activity: No   Other Topics Concern  . None   Social History Narrative   Additional History:   Assessment:   Musculoskeletal: Strength & Muscle Tone: within normal limits Gait & Station: normal Patient leans: N/A  Psychiatric Specialty Exam: HPI  Review of Systems  Psychiatric/Behavioral: Positive for depression. Negative for suicidal ideas, hallucinations, memory loss and substance abuse. The patient is not nervous/anxious and does not have insomnia.     Blood pressure 118/78, pulse 72, temperature 98 F (36.7 C), temperature source Tympanic, height 5\' 4"  (1.626 m), weight 258 lb 6.4 oz (117.209 kg), SpO2 87 %.Body mass index is 44.33 kg/(m^2).  General Appearance: Well Groomed  Eye Contact:  Good  Speech:  Clear and Coherent and Slow  Volume:  Normal  Mood:  Good  Affect:  Blunt  Thought Process:  Generally appropriate/linear but slightly impoverished  Orientation:  Full (Time, Place, and Person)  Thought Content:  Negative  Suicidal Thoughts:  No  Homicidal Thoughts:  No  Memory:  Immediate;   Good Recent;   Good Remote;   Good  Judgement:  Good  Insight:  Fair  Psychomotor Activity:  Negative  Concentration:  Fair  Recall:  Good  Fund of Knowledge: Fair  Language: Good  Akathisia:  Negative  Handed:  Right unknown  AIMS (if indicated):  Normal  Assets:  Desire for Improvement  ADL's:  Intact  Cognition: WNL  Sleep:  Five hours per night, uses CPAP   Is the patient at risk to self?  No. Has the patient been a risk to self in the past 6  months?  No. Has the patient been a risk to self within the distant past?  No. Is the patient a risk to others?  No. Has the patient been a risk to others in the past 6 months?  No. Has the patient been a risk to others within the distant past?  No.  Current Medications: Current Outpatient Prescriptions  Medication Sig Dispense Refill  . albuterol (PROVENTIL HFA;VENTOLIN HFA) 108 (90 BASE) MCG/ACT inhaler Inhale into the lungs.    Marland Kitchen amoxicillin-clavulanate (AUGMENTIN) 875-125 MG per tablet Take 1 tablet by mouth every 12 (twelve) hours.  0  . ARIPiprazole (ABILIFY) 15 MG tablet Take 1 tablet (15 mg total) by mouth daily. 30 tablet 3  . aspirin EC 81 MG tablet Take by mouth.    . Cholecalciferol 1000 UNITS tablet Take by mouth.    . citalopram (CELEXA) 40 MG tablet Take 1 tablet (40 mg total) by mouth daily. 30 tablet 3  . fluticasone (FLONASE) 50 MCG/ACT nasal spray Place into the nose.    . gabapentin (NEURONTIN) 100 MG capsule Take by mouth.    . hydrochlorothiazide (HYDRODIURIL) 12.5 MG tablet Take by mouth.    . levocetirizine (XYZAL) 5 MG tablet Take by mouth.    . levothyroxine (SYNTHROID, LEVOTHROID) 112 MCG tablet Take by mouth daily.    . Multiple Vitamin (MULTI-VITAMINS) TABS Take by mouth.    . pyridOXINE (VITAMIN B-6) 100 MG tablet Take 300 mg by mouth.    . traZODone (DESYREL) 50 MG tablet Take 50 mg by mouth.    Marland Kitchen buPROPion (WELLBUTRIN XL) 150 MG 24 hr tablet Take 1 tablet (150 mg total) by mouth daily. 30 tablet 1  . hydrochlorothiazide (HYDRODIURIL) 25 MG tablet Take by mouth.     No current facility-administered medications for this visit.    Medical Decision Making:  Established Problem, Stable/Improving (1)  Treatment Plan Summary:Medication management Patient has been stable on this medication regimen for some time. We will continue her citalopram at 40 mg daily. She'll continue her Abilify 15 mg daily. We will start Wellbutrin XL 150 mg in the morning. Risk and  benefits of been discussing patient's able consent. She'll follow up in 1 month. She's been encouraged call if questions or concerns prior to next appointment.  Faith Rogue 10/01/2014, 2:07 PM

## 2014-10-02 ENCOUNTER — Ambulatory Visit (INDEPENDENT_AMBULATORY_CARE_PROVIDER_SITE_OTHER): Payer: Medicare Other | Admitting: Licensed Clinical Social Worker

## 2014-10-02 DIAGNOSIS — F331 Major depressive disorder, recurrent, moderate: Secondary | ICD-10-CM

## 2014-10-02 DIAGNOSIS — F840 Autistic disorder: Secondary | ICD-10-CM

## 2014-10-02 DIAGNOSIS — F4321 Adjustment disorder with depressed mood: Secondary | ICD-10-CM

## 2014-10-02 NOTE — Progress Notes (Signed)
THERAPIST PROGRESS NOTE  Session Time: 1:03 p.m. - 2:00 p.m.  Participation Level: Active  Behavioral Response: CasualAlertAnxious and Depressed  Type of Therapy: Individual Therapy  Treatment Goals addressed: Coping  Interventions: Solution Focused, Strength-based, Supportive and Other: Illness management and relapse prevention  Summary: Marissa Quinn is a 59 y.o. female who presents with a recurrence of depressive symptoms as evidenced by recent episodes of feeling more down and sad, hopeless, disinterested, low motivation and poor energy.  Last OPT with client was in June 2016, at which time her affect was brighter and mood was not described as depressed.   Marissa Quinn returns to OPT to discuss illness management and gain additional coping skills.  "My mood gets down the closer I get to my birthday. I get depressed and I think it's because I've got another year of living."  Client is practicing gratitude in the midst of experiencing an increasing sense of hopelessness about her life as she lives with the chronic nature of her depression.  She is not regularly attending the gym or Church as she had been and also indicates that she does not want to talk to people as much as she usually does and has not been calling or returning calls/messages left.  Historically client had several people in her family that she speaks with daily and over past few weeks she indicated that she has not initiated calls or answered their calls.     On a positive note she will visit daughter in [place] this week-end and Marissa Quinn also visited sister and family in [place] to attend family members [employer] graduation.  Financial stress was discussed with Marissa Quinn considering her options to pay down/pay off credit cards that she has now maxed out in order to pay daughter's bills.  Family strain since client's daughter loaned client's brother a significant amount of money and since he has not paid the daughter back, Marissa Quinn has had to step in  to assist daughter with expenses for medical school and living needs.  Ironically, she is collecting rent money for her brother since he inherited a rental property from their father.  "I'm not comfortable having this money in my house."  Marissa Quinn admitted to LCSW that she has thought that the brother could use the rent money to pay back Marissa Quinn's daughter.  Overall, the situation has not been settled.  Another stressor is that the Marissa Quinn of her father has not been settled and she is often having to speak with attorneys and other people about father's assets.  At this time she is undecided about what type of ritual or remembrance she will do for her father's anniversary date or how the Holidays will be.  She seems excited that her brother will spend Christmas with she and daughter.  No health problems and blood pressure has been stable.  "I need to get back to exercising."  She has been doing more work at her job on the week-ends and finds comfort in this. 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  She indicated that she is hopeful that the light box type of item she has which is actually like a sun visor or eye glasses will help with her moods and that she will be more motivated to attend the gym and keep going to [place].  Marissa Quinn  is realistic that by her not doing as many of her previous activities that she is socially isolating herself.  Client does try to stay pro-active with her care and emotional health and asked LCSW about local support groups for Seasonal Affective Disorder.  She was very receptive to information provided to her today about support groups and strategies for increasing motivation and resiliency when depressed. Marissa Quinn also agreed to try use of technology/apps or with her own paper tracking system to track her moods  along with possible triggers/causes.  Suicidal/Homicidal: Negativewithout intent/plan.  She does endorse fleeting passive thoughts of death yet adamantly denied intent or plan to harm herself citing "I would not do that to my daughter."  Therapist Response:    Offered ongoing emotional and social support to continue building trust and rapport.  Gently reinforced client's available strengths and resiliency factors and commended for talking openly about her concerns related to mental health issues, symptoms and worries about relapse. Gently reminded to identify those strategies that allow her to recognize any patterns in thoughts or situations that precipitate an increase in depressive symptoms.  Provided two different articles: one on building motivation when depressed and the other about warning signs of relapse and learning how to keep triggers from leading to relapse.  Highlighted client's creativity when it comes to designing forms/ways of documenting important pieces of information/data and urged that she consider how this creativity can be tied into her strategies for coping with and preventing symptoms.  Validated concerns voiced while empowering client that she has the ability/skills to develop additional ways of managing her illness.  LCSW voiced a commitment to assisting Marissa Quinn to explore additional triggers for her depression/under-lying causes and to decide on new coping strategies.  Informed client about [facility] support/educational groups and suggested that she call to find out if there is a group that may interest her.  Plan: Return again in 3 weeks and client will keep all medical and psychiatric/counseling appointments.  Marissa Quinn will take medications as prescribed and report any adverse effects.  Client will take medications as prescribed.  Diagnosis: Major Depressive Disorder, Recurrent, Moderate   Autism   PTSD   Grief  Marissa Dibble, LCSW 10/02/2014

## 2014-10-16 ENCOUNTER — Ambulatory Visit: Payer: Self-pay | Admitting: Licensed Clinical Social Worker

## 2014-10-20 ENCOUNTER — Ambulatory Visit: Payer: Self-pay | Admitting: Licensed Clinical Social Worker

## 2014-10-30 ENCOUNTER — Ambulatory Visit (INDEPENDENT_AMBULATORY_CARE_PROVIDER_SITE_OTHER): Payer: Medicare Other | Admitting: Licensed Clinical Social Worker

## 2014-10-30 DIAGNOSIS — F331 Major depressive disorder, recurrent, moderate: Secondary | ICD-10-CM | POA: Diagnosis not present

## 2014-10-30 DIAGNOSIS — F840 Autistic disorder: Secondary | ICD-10-CM | POA: Diagnosis not present

## 2014-10-30 DIAGNOSIS — F4310 Post-traumatic stress disorder, unspecified: Secondary | ICD-10-CM | POA: Diagnosis not present
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
## 2014-10-31 ENCOUNTER — Ambulatory Visit (INDEPENDENT_AMBULATORY_CARE_PROVIDER_SITE_OTHER): Payer: Medicare Other | Admitting: Psychiatry

## 2014-10-31 ENCOUNTER — Encounter: Payer: Self-pay | Admitting: Psychiatry

## 2014-10-31 VITALS — BP 138/84 | HR 78 | Temp 97.7°F | Ht 64.5 in | Wt 263.6 lb

## 2014-10-31 DIAGNOSIS — F840 Autistic disorder: Secondary | ICD-10-CM | POA: Diagnosis not present

## 2014-10-31 DIAGNOSIS — F331 Major depressive disorder, recurrent, moderate: Secondary | ICD-10-CM

## 2014-10-31 DIAGNOSIS — F4321 Adjustment disorder with depressed mood: Secondary | ICD-10-CM

## 2014-10-31 MED ORDER — BUPROPION HCL ER (XL) 150 MG PO TB24: 150.0000 mg | ORAL_TABLET | Freq: Every day | ORAL | Status: DC

## 2014-10-31 NOTE — Progress Notes (Signed)
BH MD/PA/NP OP Progress Note  10/31/2014 2:16 PM Marissa Quinn  MRN:  [id number]  Subjective:  Patient returns for follow-up for major depressive disorder, moderate, recurrent in autistic spectrum disorder. Patient feels that the added Wellbutrin at the last visit has helped her mood. She stated that she tends to have some worsening depression at this time of the year because it is the same time of the year as when her father passed away in 01-20-23 and also her birthday is in September. She states that when she has her birthday it makes her think about she has lived another year without her father. She states she feels good and that she is sleeping well and eating well. Chief Complaint: better Chief Complaint    Follow-up; Medication Refill     Visit Diagnosis:     ICD-9-CM ICD-10-CM   1. Major depressive disorder, recurrent episode, moderate (HCC) 296.32 F33.1   2. Autism 299.00 F84.0   3. Grief 309.0 F43.21     Past Medical History:  Past Medical History  Diagnosis Date  . Anxiety   . Depression   . Thyroid disease     Past Surgical History  Procedure Laterality Date  . Thyroidectomy      total  . Thyroid surgery    . Gallbladder surgery    . Neck surgery     Family History:  Family History  Problem Relation Age of Onset  . Brain cancer Mother   . Hypertension Brother   . Depression Paternal Grandmother   . Dementia Father   . Alcohol abuse Father   . Depression Father   . Depression Daughter    Social History:  Social History   Social History  . Marital Status: Divorced    Spouse Name: N/A  . Number of Children: N/A  . Years of Education: N/A   Social History Main Topics  . Smoking status: Never Smoker   . Smokeless tobacco: Never Used  . Alcohol Use: No     Comment: Occasionally   . Drug Use: No  . Sexual Activity: No   Other Topics Concern  . None   Social History Narrative   Additional History:   Assessment:   Musculoskeletal: Strength &  Muscle Tone: within normal limits Gait & Station: normal Patient leans: N/A  Psychiatric Specialty Exam: HPI  Review of Systems  Psychiatric/Behavioral: Negative for depression, suicidal ideas, hallucinations, memory loss and substance abuse. The patient is not nervous/anxious and does not have insomnia.   All other systems reviewed and are negative.   Blood pressure 138/84, pulse 78, temperature 97.7 F (36.5 C), temperature source Tympanic, height 5' 4.5" (1.638 m), weight 263 lb 9.6 oz (119.568 kg), SpO2 93 %.Body mass index is 44.56 kg/(m^2).  General Appearance: Well Groomed  Eye Contact:  Good  Speech:  Clear and Coherent and Slow  Volume:  Normal  Mood:  Good  Affect:  Blunt  Thought Process:  Generally appropriate/linear but slightly impoverished  Orientation:  Full (Time, Place, and Person)  Thought Content:  Negative  Suicidal Thoughts:  No  Homicidal Thoughts:  No  Memory:  Immediate;   Good Recent;   Good Remote;   Good  Judgement:  Good  Insight:  Fair  Psychomotor Activity:  Negative  Concentration:  Fair  Recall:  Good  Fund of Knowledge: Fair  Language: Good  Akathisia:  Negative  Handed:  Right unknown  AIMS (if indicated):  Normal  Assets:  Desire for Improvement  ADL's:  Intact  Cognition: WNL  Sleep:  Five hours per night, uses CPAP   Is the patient at risk to self?  No. Has the patient been a risk to self in the past 6 months?  No. Has the patient been a risk to self within the distant past?  No. Is the patient a risk to others?  No. Has the patient been a risk to others in the past 6 months?  No. Has the patient been a risk to others within the distant past?  No.  Current Medications: Current Outpatient Prescriptions  Medication Sig Dispense Refill  . albuterol (PROVENTIL HFA;VENTOLIN HFA) 108 (90 BASE) MCG/ACT inhaler Inhale into the lungs.    Marland Kitchen amoxicillin-clavulanate (AUGMENTIN) 875-125 MG per tablet Take 1 tablet by mouth every 12 (twelve)  hours.  0  . ARIPiprazole (ABILIFY) 15 MG tablet Take 1 tablet (15 mg total) by mouth daily. 30 tablet 3  . aspirin EC 81 MG tablet Take by mouth.    Marland Kitchen buPROPion (WELLBUTRIN XL) 150 MG 24 hr tablet Take 1 tablet (150 mg total) by mouth daily. 30 tablet 2  . Cholecalciferol 1000 UNITS tablet Take by mouth.    . citalopram (CELEXA) 40 MG tablet Take 1 tablet (40 mg total) by mouth daily. 30 tablet 3  . fluticasone (FLONASE) 50 MCG/ACT nasal spray Place into the nose.    . hydrochlorothiazide (HYDRODIURIL) 12.5 MG tablet Take by mouth.    . levocetirizine (XYZAL) 5 MG tablet Take by mouth.    . levothyroxine (SYNTHROID, LEVOTHROID) 112 MCG tablet Take by mouth daily.    . Multiple Vitamin (MULTI-VITAMINS) TABS Take by mouth.    . pyridOXINE (VITAMIN B-6) 100 MG tablet Take 300 mg by mouth.    . traZODone (DESYREL) 50 MG tablet Take 50 mg by mouth.     No current facility-administered medications for this visit.    Medical Decision Making:  Established Problem, Stable/Improving (1)  Treatment Plan Summary:Medication management   Major depressive disorder, moderate, recurrent  Patient reports improvement in her depressed mood.. We will continue her citalopram at 40 mg daily. She'll continue her Abilify 15 mg daily. We will continue the Wellbutrin XL 150 mg in the morning.  Autistic spectrum disorder-stable  Patient will follow up in 2 months. She's been encouraged call any questions or concerns prior to her next appointment. Faith Rogue 10/31/2014, 2:16 PM

## 2014-11-18 ENCOUNTER — Ambulatory Visit (INDEPENDENT_AMBULATORY_CARE_PROVIDER_SITE_OTHER): Payer: Medicare Other | Admitting: Licensed Clinical Social Worker

## 2014-11-18 DIAGNOSIS — F840 Autistic disorder: Secondary | ICD-10-CM | POA: Diagnosis not present

## 2014-11-18 DIAGNOSIS — F4321 Adjustment disorder with depressed mood: Secondary | ICD-10-CM

## 2014-11-18 DIAGNOSIS — F331 Major depressive disorder, recurrent, moderate: Secondary | ICD-10-CM | POA: Diagnosis not present

## 2014-11-18 NOTE — Progress Notes (Signed)
THERAPIST PROGRESS NOTE  Session Time:  3:15 p.m. -  4:15 p.m.  Participation Level: Active  Behavioral Response: CasualAlertEuphoric  Type of Therapy: Individual Therapy  Treatment Goals addressed: Anxiety and Coping  Interventions: Solution Focused, Supportive and Reframing  Summary: Marissa Quinn is a 59 y.o. female who presents to OPT for a follow up appointment to address her symptoms and coping. "I've been feeling okay." Reports that the Wellbutrin is helping her. "I feel like I want to wrap up in a blanket and sleep but it's the weather."  Client usually up around 4:30 a.m. and turns her lights out at 11 or 11:30 p.m. Uses CPAP machine. [name] states that she feels better with only 4-5 hours of sleep and anything over that causes her to feel sluggish.  She is using her light sunglasses and in combination with Wellbutrin she believes her mood is stable.  "This is my best change of season so far." Denied feeling sad or depressed and stated that this was the first birthday that she has had where "I was actually happy to celebrate and did not have the dread that I had lived another year with the depression."  She has not returned to the gym because she has been painting furniture and has a visit planned next week, 11/25/14, to see her daughter then her daughter, Marissa Quinn will travel to see client for Thanksgiving Holiday.   [name] voiced concerns that if her coping mechanisms stop working then her depression may become worse.  Red flags that she identified are: being real negative about herself, losing interest in spending times with friends, losing interests in things, feeling helpless and hopeless and cancelling appointments.  Overall her outlook was optimistic and hopeful and she is not experiencing SI or changes in her moods or behaviors that would indicate a relapse.  Client was receptive to meeting other LCSW in the practice, Nolon Rod, and agreed with transition from this LCSW to  Ms. Peacock.  Eventually, Alvis expressed an interest to resume OPT with this LCSW after insurance contracting is secured at therapist's new practice location.  [name] indicated "I enjoy talking to you. I like it that you can bring things out of me that I was not able to see."  No immediate concerns or risk factors were voiced by client or assessed by LCSW.  Nolon Rod met client and initial OPT appointment was scheduled for client to see Joni Reining.  Suicidal/Homicidal: Negativewithout intent/plan  Therapist Response:   Offered ongoing emotional and social support to validate Marissa Quinn's experiences and emotional reaction to these.  Assessed client's functional status and for any decline in symptoms/moods/behaviors.  Gently reinforced client's available strengths and resiliency factors and commended for keeping self busy with activities that are meaningful to her.  Provided an overview of client's options for ongoing OPT and introduced client to [facility] [place].  Wished client well with the changing of seasons and encouraged her to notify Dr. Mayford Knife immediately if she begins to experience an increase in symptoms or side effects with medications.   Plan: Return again PRN.  Terminated therapeutic services with client who will continue OPT at [facility] with Nolon Rod, LCSW, in the interim until she can resume OPT with this LCSW.  Marissa Quinn will remain compliant with her medications and appointments and notify [facility] staff of any complications with medications or changes in her symptoms, moods, behaviors, etc.   Diagnosis: Major Depressive Disorder, Recurrent, In Partial Remission   PTSD   Autism  Miguel Dibble, LCSW 11/18/2014

## 2014-12-10 ENCOUNTER — Other Ambulatory Visit: Payer: Self-pay | Admitting: Internal Medicine

## 2014-12-10 DIAGNOSIS — Z1239 Encounter for other screening for malignant neoplasm of breast: Secondary | ICD-10-CM

## 2014-12-24 ENCOUNTER — Ambulatory Visit: Payer: Medicare Other

## 2014-12-24 ENCOUNTER — Other Ambulatory Visit: Payer: Self-pay | Admitting: Internal Medicine

## 2014-12-24 ENCOUNTER — Ambulatory Visit
Admission: RE | Admit: 2014-12-24 | Discharge: 2014-12-24 | Disposition: A | Discharge status: Home / Self Care | Payer: Medicare Other | Source: Ambulatory Visit | Attending: Internal Medicine | Admitting: Internal Medicine

## 2014-12-24 DIAGNOSIS — Z1239 Encounter for other screening for malignant neoplasm of breast: Secondary | ICD-10-CM

## 2014-12-24 DIAGNOSIS — Z1231 Encounter for screening mammogram for malignant neoplasm of breast: Secondary | ICD-10-CM | POA: Insufficient documentation

## 2014-12-31 ENCOUNTER — Ambulatory Visit: Payer: Self-pay | Admitting: Psychiatry

## 2015-01-09 ENCOUNTER — Ambulatory Visit (INDEPENDENT_AMBULATORY_CARE_PROVIDER_SITE_OTHER): Payer: Medicare Other | Admitting: Psychiatry

## 2015-01-09 ENCOUNTER — Encounter: Payer: Self-pay | Admitting: Psychiatry

## 2015-01-09 VITALS — BP 122/84 | HR 100 | Temp 97.7°F | Ht 64.5 in | Wt 268.0 lb

## 2015-01-09 DIAGNOSIS — F331 Major depressive disorder, recurrent, moderate: Secondary | ICD-10-CM

## 2015-01-09 DIAGNOSIS — N950 Postmenopausal bleeding: Secondary | ICD-10-CM | POA: Insufficient documentation

## 2015-01-09 MED ORDER — ARIPIPRAZOLE 15 MG PO TABS: 15.0000 mg | ORAL_TABLET | Freq: Every day | ORAL | Status: DC

## 2015-01-09 MED ORDER — BUPROPION HCL ER (XL) 150 MG PO TB24: 150.0000 mg | ORAL_TABLET | Freq: Every day | ORAL | Status: DC

## 2015-01-09 MED ORDER — CITALOPRAM HYDROBROMIDE 40 MG PO TABS: 40.0000 mg | ORAL_TABLET | Freq: Every day | ORAL | Status: DC

## 2015-01-09 NOTE — Progress Notes (Signed)
BH MD/PA/NP OP Progress Note  01/09/2015 2:43 PM Marissa Quinn  MRN:  [id number]  Subjective:  Patient returns for follow-up for major depressive disorder, moderate, recurrent in autistic spectrum disorder. She indicates her mood is been good. She states she that this is a good thing given that it's the holidays and typically she is not in a good mood during the holidays. She states that she has a brother that is going to come visit her. She states she also is going to volunteer to distribute Goody bags at a [employer]. She states that her father resided at the long-term facility at the [facility] prior to his death. States she's sleeping well and eating well. She still feels that the Wellbutrin has been very helpful in terms of helping her depressed mood. Chief Complaint: better Chief Complaint    Follow-up; Medication Refill     Visit Diagnosis:     ICD-9-CM ICD-10-CM   1. Major depressive disorder, recurrent episode, moderate (HCC) 296.32 F33.1 ARIPiprazole (ABILIFY) 15 MG tablet     citalopram (CELEXA) 40 MG tablet    Past Medical History:  Past Medical History  Diagnosis Date  . Anxiety   . Depression   . Thyroid disease     Past Surgical History  Procedure Laterality Date  . Thyroidectomy      total  . Thyroid surgery    . Gallbladder surgery    . Neck surgery     Family History:  Family History  Problem Relation Age of Onset  . Brain cancer Mother   . Hypertension Brother   . Depression Paternal Grandmother   . Dementia Father   . Alcohol abuse Father   . Depression Father   . Depression Daughter   . Breast cancer Neg Hx    Social History:  Social History   Social History  . Marital Status: Divorced    Spouse Name: N/A  . Number of Children: N/A  . Years of Education: N/A   Social History Main Topics  . Smoking status: Never Smoker   . Smokeless tobacco: Never Used  . Alcohol Use: No     Comment: Occasionally   . Drug Use: No  . Sexual Activity: No    Other Topics Concern  . None   Social History Narrative   Additional History:   Assessment:   Musculoskeletal: Strength & Muscle Tone: within normal limits Gait & Station: normal Patient leans: N/A  Psychiatric Specialty Exam: HPI  Review of Systems  Psychiatric/Behavioral: Negative for depression, suicidal ideas, hallucinations, memory loss and substance abuse. The patient is not nervous/anxious and does not have insomnia.   All other systems reviewed and are negative.   Blood pressure 122/84, pulse 100, temperature 97.7 F (36.5 C), temperature source Tympanic, height 5' 4.5" (1.638 m), weight 268 lb (121.564 kg), SpO2 93 %.Body mass index is 45.31 kg/(m^2).  General Appearance: Well Groomed  Eye Contact:  Good  Speech:  Clear and Coherent and Slow  Volume:  Normal  Mood:  Good  Affect:  Blunt  Thought Process:  Generally appropriate/linear but slightly impoverished  Orientation:  Full (Time, Place, and Person)  Thought Content:  Negative  Suicidal Thoughts:  No  Homicidal Thoughts:  No  Memory:  Immediate;   Good Recent;   Good Remote;   Good  Judgement:  Good  Insight:  Fair  Psychomotor Activity:  Negative  Concentration:  Fair  Recall:  Good  Fund of Knowledge: Fair  Language:  Good  Akathisia:  Negative  Handed:  Right unknown  AIMS (if indicated):  Done 01/09/15 and normal  Assets:  Desire for Improvement  ADL's:  Intact  Cognition: WNL  Sleep:  Five hours per night, uses CPAP   Is the patient at risk to self?  No. Has the patient been a risk to self in the past 6 months?  No. Has the patient been a risk to self within the distant past?  No. Is the patient a risk to others?  No. Has the patient been a risk to others in the past 6 months?  No. Has the patient been a risk to others within the distant past?  No.  Current Medications: Current Outpatient Prescriptions  Medication Sig Dispense Refill  . albuterol (PROVENTIL HFA;VENTOLIN HFA) 108 (90  BASE) MCG/ACT inhaler Inhale into the lungs.    Marland Kitchen amoxicillin-clavulanate (AUGMENTIN) 875-125 MG per tablet Take 1 tablet by mouth every 12 (twelve) hours.  0  . ARIPiprazole (ABILIFY) 15 MG tablet Take 1 tablet (15 mg total) by mouth daily. 30 tablet 4  . aspirin EC 81 MG tablet Take by mouth.    Marland Kitchen buPROPion (WELLBUTRIN XL) 150 MG 24 hr tablet Take 1 tablet (150 mg total) by mouth daily. 30 tablet 4  . Cholecalciferol 1000 UNITS tablet Take by mouth.    . citalopram (CELEXA) 40 MG tablet Take 1 tablet (40 mg total) by mouth daily. 30 tablet 4  . fluticasone (FLONASE) 50 MCG/ACT nasal spray Place into the nose.    . hydrochlorothiazide (HYDRODIURIL) 12.5 MG tablet Take by mouth.    . levocetirizine (XYZAL) 5 MG tablet Take by mouth.    . levothyroxine (SYNTHROID, LEVOTHROID) 112 MCG tablet Take by mouth daily.    . metroNIDAZOLE (METROGEL) 0.75 % vaginal gel Place vaginally.    . Multiple Vitamin (MULTI-VITAMINS) TABS Take by mouth.    . pyridOXINE (VITAMIN B-6) 100 MG tablet Take 300 mg by mouth.     No current facility-administered medications for this visit.    Medical Decision Making:  Established Problem, Stable/Improving (1)  Treatment Plan Summary:Medication management   Major depressive disorder, moderate, recurrent  Patient reports improvement in her depressed mood.. We will continue her citalopram at 40 mg daily. She'll continue her Abilify 15 mg daily. We will continue the Wellbutrin XL 150 mg in the morning.  Autistic spectrum disorder-stable  Patient will follow up in 3 months. I have explained to patient my departure from the practice and February 2017 and that she would be transferred to another provider within the [facility] system. She's been encouraged call any questions or concerns prior to her next appointment.  Faith Rogue 01/09/2015, 2:43 PM

## 2015-01-15 ENCOUNTER — Telehealth: Payer: Self-pay

## 2015-01-15 ENCOUNTER — Telehealth: Payer: Self-pay | Admitting: Psychiatry

## 2015-01-15 NOTE — Telephone Encounter (Signed)
Patient called back to the office. I spoke with patient today. She indicated she's been having shaking in her left hand for the past year and tried it as on and off. I tried to review her history to see whether is any correlation between this and medications. The last medication we added was Wellbutrin in September however she states that shaking is been going on for about 1 year. I indicated did not think it was her medications causing this issue. I recommended making an appointment with her primary care physician and if needed perhaps obtaining a referral to neurology depending on the primary care assessment. She was agreeable to this plan AW

## 2015-01-15 NOTE — Telephone Encounter (Signed)
patient called because she is concern that the hand shanking has increae.  she states that it is off and on but it is more frequent. Pt was last seen on  01-09-15  And next  appt is  04-09-14

## 2015-03-04 DIAGNOSIS — F325 Major depressive disorder, single episode, in full remission: Secondary | ICD-10-CM | POA: Insufficient documentation

## 2015-03-04 DIAGNOSIS — F319 Bipolar disorder, unspecified: Secondary | ICD-10-CM | POA: Insufficient documentation

## 2015-03-04 DIAGNOSIS — M222X1 Patellofemoral disorders, right knee: Secondary | ICD-10-CM | POA: Insufficient documentation

## 2015-03-04 DIAGNOSIS — M222X9 Patellofemoral disorders, unspecified knee: Secondary | ICD-10-CM | POA: Insufficient documentation

## 2015-03-04 DIAGNOSIS — Z6841 Body Mass Index (BMI) 40.0 and over, adult: Secondary | ICD-10-CM | POA: Insufficient documentation

## 2015-03-23 ENCOUNTER — Other Ambulatory Visit: Payer: Medicare Other

## 2015-03-23 NOTE — H&P (Signed)
Patient ID: Marissa Quinn is a 60 y.o. female presenting with Pre Op Consulting  on 03/10/2015  HPI: Preop visit for discussion of hysterectomy for endometrial polyps seen on [facility], with pelvic pain x several months. She is having PMB and desires definitive therapy.  EMBx 10/14/14: ENDOMETRIUM, BIOPSY:  NO HYPERPLASIA OR CARCINOMA. SCANT BENIGN ENDOMETRIAL FRAGMENTS ARE SEEN IN A SPECIMEN THAT CONSISTS MAINLY OF ENDOCERVICAL GLANDS, MUCUS, AND BLOOD.   10/22/14- Pap smear normal  Options were discussed at last visit with patient and her daughter Threasa Beards.   Past Medical History:  has a past medical history of Arthritis; Asthma without status asthmaticus; Autism; Bipolar 1 disorder (CMS-HCC); Depression, unspecified; Hypertension; Hypothyroid, unspecified; Iron deficiency anemia; Obesity; Sleep apnea; and Suicide attempt (CMS-HCC).  Past Surgical History:  has a past surgical history that includes Cholecystectomy; Endoscopic Carpal Tunnel Release (Right); Thyroidectomy Total (2010); Colonoscopy (09/27/2012); and Carpal tunnel release. Family History: family history includes Brain cancer in her mother; Cancer in her father, maternal grandfather, and paternal grandfather; Coronary artery disease in her father and paternal grandfather; Dementia in her father; Diabetes mellitus in her father; Diabetes type II in her father; Heart attack in her father and paternal grandfather; Hypertension in her father and sister; Lung cancer in her father; Prostate cancer in her father; Stroke in her father and paternal grandfather. Social History:  reports that she has never smoked. She has never used smokeless tobacco. She reports that she does not drink alcohol or use illicit drugs. OB/GYN History:  OB History    Gravida Para Term Preterm AB TAB SAB Ectopic Multiple Living   3 1 1  2  2   1       Allergies: is allergic to risperdal [risperidone]; tetracycline; macrobid [nitrofurantoin monohyd/m-cryst]; and  prevacid [lansoprazole]. Medications:  Current Outpatient Prescriptions:  . ABILIFY 15 mg tablet, Take 15 mg by mouth once daily., Disp: , Rfl: 0 . albuterol 90 mcg/actuation inhaler, Inhale 2 inhalations into the lungs every 6 (six) hours as needed for Wheezing., Disp: , Rfl:  . aspirin 81 MG EC tablet, Take 81 mg by mouth once daily., Disp: , Rfl:  . buPROPion (WELLBUTRIN XL) 150 MG XL tablet, Take 150 mg by mouth once daily., Disp: , Rfl: 0 . cholecalciferol (CHOLECALCIFEROL) 1,000 unit tablet, Take 1,000 Units by mouth once daily., Disp: , Rfl:  . citalopram (CELEXA) 40 MG tablet, Take 40 mg by mouth once daily., Disp: , Rfl: 0 . fluticasone (FLONASE) 50 mcg/actuation nasal spray, Place 2 sprays into both nostrils once daily., Disp: 16 g, Rfl: 3 . hydrochlorothiazide (HYDRODIURIL) 12.5 MG tablet, Take 1 tablet (12.5 mg total) by mouth once daily., Disp: 30 tablet, Rfl: 11 . levocetirizine (XYZAL) 5 MG tablet, Take 1 tablet (5 mg total) by mouth every evening., Disp: 30 tablet, Rfl: 11 . levothyroxine (SYNTHROID, LEVOTHROID) 112 MCG tablet, take 1 tablet by mouth once daily, Disp: 90 tablet, Rfl: 3 . meloxicam (MOBIC) 15 MG tablet, Take 1 tablet (15 mg total) by mouth once daily., Disp: 30 tablet, Rfl: 2 . multivitamin tablet, Take 1 tablet by mouth once daily., Disp: , Rfl:  . ondansetron (ZOFRAN) 4 MG tablet, Take 1 tablet (4 mg total) by mouth every 8 (eight) hours as needed for Nausea. (Patient not taking: Reported on 01/08/2015 ), Disp: 30 tablet, Rfl: 0  Review of Systems  Constitutional: Negative. Negative for fatigue and fever.  HENT: Negative.  Respiratory: Negative. Negative for shortness of breath.  Cardiovascular: Negative for chest pain  and palpitations.  Gastrointestinal: Negative for abdominal pain, constipation and diarrhea.  Endocrine: Negative for cold intolerance.  Genitourinary: Positive for pelvic pain and vaginal bleeding. Negative for difficulty urinating,  dyspareunia, dysuria, frequency, hematuria, menstrual problem, urgency, vaginal discharge and vaginal pain.  + Pelvic pain  Neurological: Negative for dizziness and light-headedness.  Psychiatric/Behavioral:  No changes in mood    Exam:         Visit Vitals  . BP 132/70  . Pulse 68  . Wt (!) 123.4 kg (272 lb)  . BMI 45.97 kg/m2   Physical Exam  Constitutional: She is oriented to person, place, and time. She appears well-developed and well-nourished. No distress.  Eyes: No scleral icterus.  Neck: Normal range of motion. Neck supple. No tracheal deviation present. No thyromegaly present.  Cardiovascular: Normal rate, regular rhythm and normal heart sounds.  No murmur heard. Pulmonary/Chest: Effort normal and breath sounds normal. She has no wheezes. She has no rales. Right breast exhibits no inverted nipple, no mass, no nipple discharge, no skin change and no tenderness. Left breast exhibits no inverted nipple, no mass, no nipple discharge, no skin change and no tenderness. Breasts are symmetrical.  Abdominal: She exhibits no distension and no mass. There is no tenderness. There is no rebound and no guarding.  Genitourinary:  Genitourinary Comments:  Pelvic exam: External: Tanner stage 5, normal female genitalia without lesions or masses Bladder: Normal size without masses or tenderness, well-supported Urethra: No lesions or discharge with palpation. Normal urethral size and location, no prolapse Vagina: normal physiological discharge, without lesions or masses Cervix: normal without lesions or masses Adnexa: normal bimanual exam without masses or fullness Uterus: Normal size and position without masses or tenderness.  Anus/Perineum: Normal external exam  Musculoskeletal: Normal range of motion.  Lymphadenopathy:  She has no cervical adenopathy.  Neurological: She is alert and oriented to person, place, and time.  Skin: Skin is warm and dry.  Psychiatric: She has a normal  mood and affect.   No uterine prolapse. External tissue dystocia limiting exam.  Impression:   The primary encounter diagnosis was Preop examination. Diagnoses of Post-menopausal bleeding, Endometrial polyp, and Pelvic pain in female were also pertinent to this visit.    Plan:   Patient returns for a preoperative discussion regarding her plans to proceed with definitive surgical treatment of her symptomatic endometrial polyp and pelvic pain by robotic assisted total laparoscopic hysterectomy with bilateral salpingoophorectomy. I recommend robotic-assistance because of her hx of BMI 46 and prior abdominal surgeries without uterine descent. If under anesthesia a TVH is appropriate, I will perform this surgery.  The patient and I discussed the technical aspects of the procedure including the potential for risks and complications. These include but are not limited to the risk of infection requiring post-operative antibiotics or further procedures. We talked about the risk of injury to adjacent organs including bladder, bowel, ureter, blood vessels or nerves. We talked about the need to convert to an open incision. We talked about the possible need for blood transfusion. We talked aboutpostop complications such asthromboembolic or cardiopulmonary complications. All of her questions were answered. Her preoperative exam was completed and the appropriate consents were signed. She is scheduled to undergo this procedure in the near future.  Return in about 5 weeks (around 04/23/2015) for Postop check.  Sherrie George, MD

## 2015-03-24 ENCOUNTER — Encounter
Admission: RE | Admit: 2015-03-24 | Discharge: 2015-03-24 | Disposition: A | Discharge status: Home / Self Care | Payer: Medicare Other | Source: Ambulatory Visit | Attending: Obstetrics and Gynecology | Admitting: Obstetrics and Gynecology

## 2015-03-24 DIAGNOSIS — Z01812 Encounter for preprocedural laboratory examination: Secondary | ICD-10-CM | POA: Diagnosis not present

## 2015-03-24 HISTORY — DX: Sleep apnea, unspecified: G47.30

## 2015-03-24 HISTORY — DX: Other allergy status, other than to drugs and biological substances: Z91.09

## 2015-03-24 HISTORY — DX: Unspecified asthma, uncomplicated: J45.909

## 2015-03-24 HISTORY — DX: Hypothyroidism, unspecified: E03.9

## 2015-03-24 LAB — TYPE AND SCREEN
ABO/RH(D): B POS
Antibody Screen: NEGATIVE

## 2015-03-24 LAB — ABO/RH: ABO/RH(D): B POS

## 2015-03-24 LAB — BASIC METABOLIC PANEL
ANION GAP: 4 — AB (ref 5–15)
BUN: 18 mg/dL (ref 6–20)
CALCIUM: 9.8 mg/dL (ref 8.9–10.3)
CO2: 33 mmol/L — ABNORMAL HIGH (ref 22–32)
Chloride: 106 mmol/L (ref 101–111)
Creatinine, Ser: 1.09 mg/dL — ABNORMAL HIGH (ref 0.44–1.00)
GFR calc Af Amer: >60 mL/min (ref >60)
GFR calc non Af Amer: 55 mL/min — ABNORMAL LOW (ref >60)
GLUCOSE: 81 mg/dL (ref 65–99)
POTASSIUM: 3.7 mmol/L (ref 3.5–5.1)
SODIUM: 143 mmol/L (ref 135–145)

## 2015-03-24 LAB — CBC
HEMATOCRIT: 37.8 % (ref 35.0–47.0)
HEMOGLOBIN: 12.7 g/dL (ref 12.0–16.0)
MCH: 30.2 pg (ref 26.0–34.0)
MCHC: 33.7 g/dL (ref 32.0–36.0)
MCV: 89.7 fL (ref 80.0–100.0)
Platelets: 229 10*3/uL (ref 150–440)
RBC: 4.21 MIL/uL (ref 3.80–5.20)
RDW: 14.7 % — ABNORMAL HIGH (ref 11.5–14.5)
WBC: 4.6 10*3/uL (ref 3.6–11.0)

## 2015-03-24 NOTE — Patient Instructions (Signed)
  Your procedure is scheduled on: 03/30/15 Mon Report to Day Surgery.2nd floor medical mall To find out your arrival time please call [phone] between 1PM - 3PM on 03/27/15 Fri.  Remember: Instructions that are not followed completely may result in serious medical risk, up to and including death, or upon the discretion of your surgeon and anesthesiologist your surgery may need to be rescheduled.    _x___ 1. Do not eat food or drink liquids after midnight. No gum chewing or hard candies.     ____ 2. No Alcohol for 24 hours before or after surgery.   ____ 3. Bring all medications with you on the day of surgery if instructed.    __x__ 4. Notify your doctor if there is any change in your medical condition     (cold, fever, infections).     Do not wear jewelry, make-up, hairpins, clips or nail polish.  Do not wear lotions, powders, or perfumes. You may wear deodorant.  Do not shave 48 hours prior to surgery. Men may shave face and neck.  Do not bring valuables to the hospital.    [facility] is not responsible for any belongings or valuables.               Contacts, dentures or bridgework may not be worn into surgery.  Leave your suitcase in the car. After surgery it may be brought to your room.  For patients admitted to the hospital, discharge time is determined by your                treatment team.   Patients discharged the day of surgery will not be allowed to drive home.   Please read over the following fact sheets that you were given:      _x___ Take these medicines the morning of surgery with A SIP OF WATER:    1. albuterol (PROVENTIL HFA;VENTOLIN HFA) 108 (90 BASE) MCG/ACT inhaler  2. buPROPion (WELLBUTRIN XL) 150 MG 24 hr tablet  3. levothyroxine (SYNTHROID, LEVOTHROID) 112 MCG tablet  4.  5.  6.  ____ Fleet Enema (as directed)   _x___ Use CHG Soap as directed  _x___ Use inhalers on the day of surgery  ____ Stop metformin 2 days prior to surgery    ____ Take 1/2  of usual insulin dose the night before surgery and none on the morning of surgery.   _x___ Stop Coumadin/Plavix/aspirin on Stopped Aspirin last Thurs  _x___ Stop Anti-inflammatories on stop ibuprofen, etc until after surgery, may take Tylenol as needed   ____ Stop supplements until after surgery.    _x___ Bring C-Pap to the hospital.

## 2015-03-24 NOTE — Pre-Procedure Instructions (Signed)
Instructed how to use incentive spirometry. One sent home for practice, instructed to bring to hospital day of surgery.

## 2015-03-30 ENCOUNTER — Encounter: Payer: Self-pay | Admitting: *Deleted

## 2015-03-30 ENCOUNTER — Ambulatory Visit
Admission: RE | Admit: 2015-03-30 | Discharge: 2015-03-30 | Disposition: A | Discharge status: Home / Self Care | Payer: Medicare Other | Source: Ambulatory Visit | Attending: Obstetrics and Gynecology | Admitting: Obstetrics and Gynecology

## 2015-03-30 ENCOUNTER — Ambulatory Visit: Payer: Medicare Other | Admitting: *Deleted

## 2015-03-30 ENCOUNTER — Other Ambulatory Visit: Payer: Self-pay | Admitting: Obstetrics and Gynecology

## 2015-03-30 ENCOUNTER — Encounter
Admission: RE | Disposition: A | Discharge status: Home / Self Care | Payer: Self-pay | Source: Ambulatory Visit | Attending: Obstetrics and Gynecology

## 2015-03-30 DIAGNOSIS — F319 Bipolar disorder, unspecified: Secondary | ICD-10-CM | POA: Diagnosis not present

## 2015-03-30 DIAGNOSIS — M1990 Unspecified osteoarthritis, unspecified site: Secondary | ICD-10-CM | POA: Insufficient documentation

## 2015-03-30 DIAGNOSIS — Z823 Family history of stroke: Secondary | ICD-10-CM | POA: Diagnosis not present

## 2015-03-30 DIAGNOSIS — Z881 Allergy status to other antibiotic agents status: Secondary | ICD-10-CM | POA: Insufficient documentation

## 2015-03-30 DIAGNOSIS — Z6841 Body Mass Index (BMI) 40.0 and over, adult: Secondary | ICD-10-CM | POA: Diagnosis not present

## 2015-03-30 DIAGNOSIS — Z7951 Long term (current) use of inhaled steroids: Secondary | ICD-10-CM | POA: Insufficient documentation

## 2015-03-30 DIAGNOSIS — F840 Autistic disorder: Secondary | ICD-10-CM | POA: Insufficient documentation

## 2015-03-30 DIAGNOSIS — Z7982 Long term (current) use of aspirin: Secondary | ICD-10-CM | POA: Insufficient documentation

## 2015-03-30 DIAGNOSIS — I10 Essential (primary) hypertension: Secondary | ICD-10-CM | POA: Insufficient documentation

## 2015-03-30 DIAGNOSIS — Z9049 Acquired absence of other specified parts of digestive tract: Secondary | ICD-10-CM | POA: Diagnosis not present

## 2015-03-30 DIAGNOSIS — N939 Abnormal uterine and vaginal bleeding, unspecified: Secondary | ICD-10-CM | POA: Insufficient documentation

## 2015-03-30 DIAGNOSIS — Z5309 Procedure and treatment not carried out because of other contraindication: Secondary | ICD-10-CM | POA: Diagnosis not present

## 2015-03-30 DIAGNOSIS — E890 Postprocedural hypothyroidism: Secondary | ICD-10-CM | POA: Insufficient documentation

## 2015-03-30 DIAGNOSIS — N840 Polyp of corpus uteri: Secondary | ICD-10-CM | POA: Diagnosis not present

## 2015-03-30 DIAGNOSIS — Z888 Allergy status to other drugs, medicaments and biological substances status: Secondary | ICD-10-CM | POA: Insufficient documentation

## 2015-03-30 DIAGNOSIS — Z79899 Other long term (current) drug therapy: Secondary | ICD-10-CM | POA: Insufficient documentation

## 2015-03-30 DIAGNOSIS — E039 Hypothyroidism, unspecified: Secondary | ICD-10-CM | POA: Diagnosis not present

## 2015-03-30 DIAGNOSIS — Z8042 Family history of malignant neoplasm of prostate: Secondary | ICD-10-CM | POA: Insufficient documentation

## 2015-03-30 DIAGNOSIS — D509 Iron deficiency anemia, unspecified: Secondary | ICD-10-CM | POA: Insufficient documentation

## 2015-03-30 DIAGNOSIS — N950 Postmenopausal bleeding: Secondary | ICD-10-CM | POA: Diagnosis not present

## 2015-03-30 DIAGNOSIS — Z8249 Family history of ischemic heart disease and other diseases of the circulatory system: Secondary | ICD-10-CM | POA: Insufficient documentation

## 2015-03-30 DIAGNOSIS — J45909 Unspecified asthma, uncomplicated: Secondary | ICD-10-CM | POA: Insufficient documentation

## 2015-03-30 DIAGNOSIS — Z801 Family history of malignant neoplasm of trachea, bronchus and lung: Secondary | ICD-10-CM | POA: Insufficient documentation

## 2015-03-30 DIAGNOSIS — Z833 Family history of diabetes mellitus: Secondary | ICD-10-CM | POA: Insufficient documentation

## 2015-03-30 DIAGNOSIS — E669 Obesity, unspecified: Secondary | ICD-10-CM | POA: Diagnosis not present

## 2015-03-30 DIAGNOSIS — R102 Pelvic and perineal pain: Secondary | ICD-10-CM | POA: Diagnosis not present

## 2015-03-30 DIAGNOSIS — G4730 Sleep apnea, unspecified: Secondary | ICD-10-CM | POA: Insufficient documentation

## 2015-03-30 DIAGNOSIS — Z808 Family history of malignant neoplasm of other organs or systems: Secondary | ICD-10-CM | POA: Diagnosis not present

## 2015-03-30 DIAGNOSIS — F329 Major depressive disorder, single episode, unspecified: Secondary | ICD-10-CM | POA: Diagnosis not present

## 2015-03-30 HISTORY — PX: ROBOTIC ASSISTED LAPAROSCOPIC HYSTERECTOMY AND SALPINGECTOMY: SHX6379

## 2015-03-30 SURGERY — CANCELLED PROCEDURE
Anesthesia: General

## 2015-03-30 MED ORDER — DEXAMETHASONE SODIUM PHOSPHATE 10 MG/ML IJ SOLN
INTRAMUSCULAR | Status: DC | PRN
  Administered 2015-03-30 (×2): 10 mg via INTRAVENOUS

## 2015-03-30 MED ORDER — BUPIVACAINE HCL (PF) 0.5 % IJ SOLN
INTRAMUSCULAR | Status: AC
  Filled 2015-03-30: qty 30

## 2015-03-30 MED ORDER — LACTATED RINGERS IV SOLN: INTRAVENOUS | Status: DC

## 2015-03-30 MED ORDER — FAMOTIDINE 20 MG PO TABS
ORAL_TABLET | ORAL | Status: AC
  Administered 2015-03-30: 20 mg via ORAL
  Filled 2015-03-30: qty 1

## 2015-03-30 MED ORDER — LACTATED RINGERS IV SOLN
INTRAVENOUS | Status: DC
  Administered 2015-03-30: 09:00:00 via INTRAVENOUS

## 2015-03-30 MED ORDER — CEFAZOLIN SODIUM-DEXTROSE 2-3 GM-% IV SOLR
INTRAVENOUS | Status: AC
  Filled 2015-03-30: qty 50

## 2015-03-30 MED ORDER — MIDAZOLAM HCL 2 MG/2ML IJ SOLN
INTRAMUSCULAR | Status: DC | PRN
  Administered 2015-03-30: 2 mg via INTRAVENOUS

## 2015-03-30 MED ORDER — CEFAZOLIN SODIUM-DEXTROSE 2-3 GM-% IV SOLR: 2.0000 g | INTRAVENOUS | Status: DC

## 2015-03-30 MED ORDER — FENTANYL CITRATE (PF) 100 MCG/2ML IJ SOLN
INTRAMUSCULAR | Status: DC | PRN
  Administered 2015-03-30: 100 ug via INTRAVENOUS

## 2015-03-30 MED ORDER — SUCCINYLCHOLINE CHLORIDE 20 MG/ML IJ SOLN
INTRAMUSCULAR | Status: DC | PRN
  Administered 2015-03-30: 140 mg via INTRAVENOUS
  Administered 2015-03-30: 100 mg via INTRAVENOUS

## 2015-03-30 MED ORDER — FAMOTIDINE 20 MG PO TABS
20.0000 mg | ORAL_TABLET | Freq: Once | ORAL | Status: AC
  Administered 2015-03-30: 20 mg via ORAL

## 2015-03-30 MED ORDER — GLYCOPYRROLATE 0.2 MG/ML IJ SOLN
INTRAMUSCULAR | Status: DC | PRN
  Administered 2015-03-30: 0.4 mg via INTRAVENOUS

## 2015-03-30 MED ORDER — PROPOFOL 10 MG/ML IV BOLUS
INTRAVENOUS | Status: DC | PRN
  Administered 2015-03-30: 150 mg via INTRAVENOUS
  Administered 2015-03-30: 100 mg via INTRAVENOUS

## 2015-03-30 SURGICAL SUPPLY — 62 items
BAG URO DRAIN 2000ML W/SPOUT (MISCELLANEOUS) ×4 IMPLANT
BLADE SURG SZ11 CARB STEEL (BLADE) ×4 IMPLANT
CANISTER SUCT 1200ML W/VALVE (MISCELLANEOUS) ×4 IMPLANT
CATH FOLEY 2WAY  5CC 16FR (CATHETERS)
CATH FOLEY 2WAY 5CC 16FR (CATHETERS)
CATH URTH 16FR FL 2W BLN LF (CATHETERS) ×4 IMPLANT
CHLORAPREP W/TINT 26ML (MISCELLANEOUS) ×4 IMPLANT
CORD BIP STRL DISP 12FT (MISCELLANEOUS) ×4 IMPLANT
COVER TIP SHEARS 8 DVNC (MISCELLANEOUS) ×4 IMPLANT
COVER TIP SHEARS 8MM DA VINCI (MISCELLANEOUS)
CRADLE LAMINECT ARM (MISCELLANEOUS) ×4 IMPLANT
DEFOGGER SCOPE WARMER CLEARIFY (MISCELLANEOUS) ×4 IMPLANT
DRAPE 3 ARM ACCESS DA VINCI (DRAPES)
DRAPE 3 ARM ACCESS DVNC (DRAPES) ×4 IMPLANT
DRAPE LEGGINS SURG 28X43 STRL (DRAPES) ×4 IMPLANT
DRAPE SHEET LG 3/4 BI-LAMINATE (DRAPES) ×8 IMPLANT
DRAPE UNDER BUTTOCK W/FLU (DRAPES) ×4 IMPLANT
ELECT REM PT RETURN 9FT ADLT (ELECTROSURGICAL)
ELECTRODE REM PT RTRN 9FT ADLT (ELECTROSURGICAL) ×4 IMPLANT
FILTER LAP SMOKE EVAC STRL (MISCELLANEOUS) ×4 IMPLANT
GLOVE BIO SURGEON STRL SZ 6.5 (GLOVE) ×16 IMPLANT
GLOVE INDICATOR 7.0 STRL GRN (GLOVE) ×16 IMPLANT
GOWN STRL REUS W/ TWL LRG LVL3 (GOWN DISPOSABLE) ×32 IMPLANT
GOWN STRL REUS W/TWL LRG LVL3 (GOWN DISPOSABLE)
GRASPER SUT TROCAR 14GX15 (MISCELLANEOUS) ×4 IMPLANT
IRRIGATION STRYKERFLOW (MISCELLANEOUS) ×4 IMPLANT
IRRIGATOR STRYKERFLOW (MISCELLANEOUS)
IV NS 1000ML (IV SOLUTION)
IV NS 1000ML BAXH (IV SOLUTION) ×4 IMPLANT
KIT PINK PAD W/HEAD ARE REST (MISCELLANEOUS)
KIT PINK PAD W/HEAD ARM REST (MISCELLANEOUS) ×4 IMPLANT
KIT RM TURNOVER CYSTO AR (KITS) ×4 IMPLANT
LABEL OR SOLS (LABEL) ×4 IMPLANT
LIQUID BAND (GAUZE/BANDAGES/DRESSINGS) ×4 IMPLANT
MANIPULATOR VCARE LG CRV RETR (MISCELLANEOUS) ×4 IMPLANT
MANIPULATOR VCARE STD CRV RETR (MISCELLANEOUS) ×4 IMPLANT
NS IRRIG 1000ML POUR BTL (IV SOLUTION) ×4 IMPLANT
OCCLUDER COLPOPNEUMO (BALLOONS) ×4 IMPLANT
PACK GYN LAPAROSCOPIC (MISCELLANEOUS) ×4 IMPLANT
PAD OB MATERNITY 4.3X12.25 (PERSONAL CARE ITEMS) ×4 IMPLANT
PAD PREP 24X41 OB/GYN DISP (PERSONAL CARE ITEMS) ×4 IMPLANT
SCISSORS METZENBAUM CVD 33 (INSTRUMENTS) ×4 IMPLANT
SET CYSTO W/LG BORE CLAMP LF (SET/KITS/TRAYS/PACK) ×4 IMPLANT
SOLUTION ELECTROLUBE (MISCELLANEOUS) ×4 IMPLANT
SURGILUBE 2OZ TUBE FLIPTOP (MISCELLANEOUS) ×4 IMPLANT
SUT DVC VLOC 180 0 12IN GS21 (SUTURE)
SUT VIC AB 0 CT2 27 (SUTURE) ×4 IMPLANT
SUT VIC AB 1 CT1 36 (SUTURE) ×4 IMPLANT
SUT VIC AB 2-0 CT1 27 (SUTURE)
SUT VIC AB 2-0 CT1 TAPERPNT 27 (SUTURE) ×4 IMPLANT
SUT VIC AB 4-0 SH 27 (SUTURE)
SUT VIC AB 4-0 SH 27XANBCTRL (SUTURE) ×8 IMPLANT
SUT VICRYL 0 AB UR-6 (SUTURE) ×8 IMPLANT
SUTURE DVC VLC 180 0 12IN GS21 (SUTURE) ×4 IMPLANT
SYR 50ML LL SCALE MARK (SYRINGE) ×4 IMPLANT
SYRINGE 10CC LL (SYRINGE) ×4 IMPLANT
TROCAR 12M 150ML BLUNT (TROCAR) ×4 IMPLANT
TROCAR DISP BLADELESS 8 DVNC (TROCAR) ×4 IMPLANT
TROCAR DISP BLADELESS 8MM (TROCAR)
TROCAR ENDO BLADELESS 11MM (ENDOMECHANICALS) ×4 IMPLANT
TROCAR XCEL 12X100 BLDLESS (ENDOMECHANICALS) ×4 IMPLANT
TUBING INSUFFLATOR HEATED (MISCELLANEOUS) ×4 IMPLANT

## 2015-03-30 NOTE — Interval H&P Note (Signed)
History and Physical Interval Note:  03/30/2015 9:26 AM  Marissa Quinn  has presented today for surgery, with the diagnosis of endometrial polyp,pelvic pain  The various methods of treatment have been discussed with the patient and family. After consideration of risks, benefits and other options for treatment, the patient has consented to  Procedure(s): ROBOTIC ASSISTED TOTAL HYSTERECTOMY WITH SALPINGECTOMY (Bilateral) and oopherectomy as a surgical intervention .  The patient's history has been reviewed, patient examined, no change in status, stable for surgery.  I have reviewed the patient's chart and labs.  Questions were answered to the patient's satisfaction.     Benjaman Kindler

## 2015-03-30 NOTE — Transfer of Care (Signed)
Immediate Anesthesia Transfer of Care Note  Patient: Marissa Quinn  Procedure(s) Performed: * No procedures listed *  Patient Location: PACU  Anesthesia Type:General  Level of Consciousness: awake, alert  and oriented  Airway & Oxygen Therapy: Patient Spontanous Breathing and Patient connected to face mask oxygen  Post-op Assessment: Report given to RN and Post -op Vital signs reviewed and stable  Post vital signs: Reviewed and stable  Last Vitals:  Filed Vitals:   03/30/15 0814 03/30/15 1028  BP: 133/75 132/70  Pulse: 74 96  Temp: 35.7 C 36.1 C  Resp: 16 18    Complications: No apparent anesthesia complications

## 2015-03-30 NOTE — Op Note (Signed)
Operative Note   SURGERY DATE: 03/30/2015  PRE-OP DIAGNOSIS:  1. Endometrial polyp 2.  Abnormal uterine bleeding    Planned PROCEDURES, aborted: 1. Exam under anesthesia 2. ROBOT ASSISTED LAPAROSCOPY, SURGICAL, WITH TOTAL HYSTERECTOMY, FOR UTERUS 250 G OR LESS; WITH REMOVAL OF TUBE(S) AND / OR OVARY(S)  SURGEON: Angelina Pih, MD, MPH  STAFF -  Circulator: Gustavo Lah, RN, BSN Scrub Tech: Justice Britain, CST  CONDITION: Hemodynamically stable to PACU  Antibiotics: Ancef 2g iv within 1 hour prior to start of procedure  VTE prophylaxis: was ordered perioperatively.   PROCEDURE IN DETAIL: After informed consent was obtained, the patient was taken to the operating room. General anesthesia was planned; however, intubation was unsuccessful and the procedure was aborted.   Anesthesia was reversed without difficulty.  The patient tolerated the procedure well.  Sponge, lap and needle counts were correct x2.  The patient was taken to recovery room in excellent condition.  I spoke with the patient and her daughter Threasa Beards re: the aborted procedure and the difficult intubation.   After discussing the findings with Dr. Andree Elk, he feels they can succesfully intubate this patient with preparation, including preop medications to decrease secretions and using the fiberoptic intubation scope initially. We will reschedule at her convenience and will call her later this week.

## 2015-03-30 NOTE — Anesthesia Preprocedure Evaluation (Signed)
Anesthesia Evaluation  Patient identified by MRN, date of birth, ID band Patient awake    Reviewed: Allergy & Precautions, H&P , NPO status , Patient's Chart, lab work & pertinent test results, reviewed documented beta blocker date and time   Airway Mallampati: III  TM Distance: >3 FB Neck ROM: full    Dental  (+) Teeth Intact   Pulmonary neg pulmonary ROS, asthma , sleep apnea and Continuous Positive Airway Pressure Ventilation ,    Pulmonary exam normal        Cardiovascular hypertension, negative cardio ROS Normal cardiovascular exam Rhythm:regular Rate:Normal     Neuro/Psych PSYCHIATRIC DISORDERS  Neuromuscular disease negative neurological ROS  negative psych ROS   GI/Hepatic negative GI ROS, Neg liver ROS,   Endo/Other  negative endocrine ROSHypothyroidism   Renal/GU negative Renal ROS  negative genitourinary   Musculoskeletal   Abdominal   Peds  Hematology negative hematology ROS (+)   Anesthesia Other Findings Past Medical History:   Anxiety                                                      Depression                                                   Thyroid disease                                              Sleep apnea                                                  Environmental allergies                                      Hypothyroidism                                               Asthma                                                     Past Surgical History:   THYROIDECTOMY                                                   Comment:total   THYROID SURGERY  GALLBLADDER SURGERY                                           NECK SURGERY                                                BMI    Body Mass Index   46.66 kg/m 2     Reproductive/Obstetrics negative OB ROS                             Anesthesia Physical Anesthesia  Plan  ASA: III  Anesthesia Plan: General ETT   Post-op Pain Management:    Induction:   Airway Management Planned:   Additional Equipment:   Intra-op Plan:   Post-operative Plan:   Informed Consent: I have reviewed the patients History and Physical, chart, labs and discussed the procedure including the risks, benefits and alternatives for the proposed anesthesia with the patient or authorized representative who has indicated his/her understanding and acceptance.   Dental Advisory Given  Plan Discussed with: CRNA  Anesthesia Plan Comments:         Anesthesia Quick Evaluation

## 2015-03-30 NOTE — Discharge Instructions (Signed)

## 2015-03-31 NOTE — Anesthesia Postprocedure Evaluation (Signed)
Anesthesia Post Note  Patient: Marissa Quinn  Procedure(s) Performed: Procedure(s) (LRB): CANCELLED PROCEDURE DUE TO DIFFICULT AIRWAY INTUBATION (N/A)  Patient location during evaluation: PACU Anesthesia Type: General Level of consciousness: awake and alert Pain management: pain level controlled Vital Signs Assessment: post-procedure vital signs reviewed and stable Respiratory status: spontaneous breathing, nonlabored ventilation, respiratory function stable and patient connected to nasal cannula oxygen Cardiovascular status: blood pressure returned to baseline and stable Postop Assessment: no signs of nausea or vomiting Anesthetic complications: no    Last Vitals:  Filed Vitals:   03/30/15 1115 03/30/15 1127  BP: 124/77 140/85  Pulse: 92 88  Temp: 35.9 C   Resp: 14     Last Pain:  Filed Vitals:   03/31/15 1035  PainSc: 0-No pain                 Molli Barrows

## 2015-04-09 ENCOUNTER — Encounter: Payer: Self-pay | Admitting: Psychiatry

## 2015-04-09 ENCOUNTER — Ambulatory Visit (INDEPENDENT_AMBULATORY_CARE_PROVIDER_SITE_OTHER): Payer: Medicare Other | Admitting: Psychiatry

## 2015-04-09 VITALS — BP 142/86 | HR 89 | Temp 97.3°F | Ht 64.0 in | Wt 274.0 lb

## 2015-04-09 DIAGNOSIS — F331 Major depressive disorder, recurrent, moderate: Secondary | ICD-10-CM

## 2015-04-09 DIAGNOSIS — R9389 Abnormal findings on diagnostic imaging of other specified body structures: Secondary | ICD-10-CM | POA: Insufficient documentation

## 2015-04-09 MED ORDER — BUPROPION HCL ER (XL) 150 MG PO TB24: 150.0000 mg | ORAL_TABLET | Freq: Every day | ORAL | Status: DC

## 2015-04-09 MED ORDER — ARIPIPRAZOLE 15 MG PO TABS: 15.0000 mg | ORAL_TABLET | Freq: Every day | ORAL | Status: DC

## 2015-04-09 MED ORDER — CITALOPRAM HYDROBROMIDE 40 MG PO TABS: 40.0000 mg | ORAL_TABLET | Freq: Every day | ORAL | Status: DC

## 2015-04-09 NOTE — Progress Notes (Signed)
Patient ID: Marissa Quinn, female   DOB: [birth year], 60 y.o.   MRN: [id number] [facility] MD/PA/NP OP Progress Note  04/09/2015 2:34 PM EMBRI FAZEL  MRN:  [id number]  Subjective:  Patient returns for follow-up for major depressive disorder, moderate, recurrent in autistic spectrum disorder. Patient was previously seen by Dr.Williams. This is the first visit for this patient with this clinician. States she is doing well moodwise. She is enjoying going to church and listening to [employer]. Patient is compliant with her medications and denies any side effects. States that she is eating well and sleeping well. 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 States that her daughter is in  [name] PhD program at [employer] and she is proud of her. States she gets to see her doctor every 2 months.   Chief Complaint: better Chief Complaint    Follow-up; Medication Refill     Visit Diagnosis:   No diagnosis found.  Past Medical History:  Past Medical History  Diagnosis Date  . Anxiety   . Depression   . Thyroid disease   . Sleep apnea   . Environmental allergies   . Hypothyroidism   . Asthma     Past Surgical History  Procedure Laterality Date  . Thyroidectomy      total  . Thyroid surgery    . Gallbladder surgery    . Neck surgery     Family History:  Family History  Problem Relation Age of Onset  . Brain cancer Mother   . Hypertension Brother   . Depression Paternal Grandmother   . Dementia Father   . Alcohol abuse Father   . Depression Father   . Depression Daughter   . Breast cancer Neg Hx    Social History:  Social History   Social History  . Marital Status: Divorced    Spouse Name: N/A  . Number of Children: N/A  . Years of Education: N/A   Social History Main Topics  . Smoking status: Never Smoker   . Smokeless tobacco: Never Used  . Alcohol Use: No     Comment: Occasionally   . Drug Use: No  . Sexual Activity: No   Other  Topics Concern  . None   Social History Narrative   Additional History:   Assessment:   Musculoskeletal: Strength & Muscle Tone: within normal limits Gait & Station: normal Patient leans: N/A  Psychiatric Specialty Exam: HPI  Review of Systems  Psychiatric/Behavioral: Negative for depression, suicidal ideas, hallucinations, memory loss and substance abuse. The patient is not nervous/anxious and does not have insomnia.   All other systems reviewed and are negative.   Blood pressure 142/86, pulse 89, temperature 97.3 F (36.3 C), temperature source Tympanic, height 5\' 4"  (1.626 m), weight 274 lb (124.286 kg), last menstrual period 03/24/2011, SpO2 91 %.Body mass index is 47.01 kg/(m^2).  General Appearance: Well Groomed  Eye Contact:  Good  Speech:  Clear and Coherent and Slow  Volume:  Normal  Mood:  Good  Affect:  Blunt  Thought Process:  Generally appropriate/linear but slightly impoverished  Orientation:  Full (Time, Place, and Person)  Thought Content:  Negative  Suicidal Thoughts:  No  Homicidal Thoughts:  No  Memory:  Immediate;   Good Recent;   Good Remote;   Good  Judgement:  Good  Insight:  Fair  Psychomotor Activity:  Negative  Concentration:  Fair  Recall:  Good  Fund of Knowledge:  Fair  Language: Good  Akathisia:  Negative  Handed:  Right unknown  AIMS (if indicated):  Done 04/08/2015 and normal  Assets:  Desire for Improvement  ADL's:  Intact  Cognition: WNL  Sleep:  Five hours per night, uses CPAP   Is the patient at risk to self?  No. Has the patient been a risk to self in the past 6 months?  No. Has the patient been a risk to self within the distant past?  No. Is the patient a risk to others?  No. Has the patient been a risk to others in the past 6 months?  No. Has the patient been a risk to others within the distant past?  No.  Current Medications: Current Outpatient Prescriptions  Medication Sig Dispense Refill  . albuterol (PROVENTIL  HFA;VENTOLIN HFA) 108 (90 BASE) MCG/ACT inhaler Inhale 1 puff into the lungs every 4 (four) hours as needed.     . ARIPiprazole (ABILIFY) 15 MG tablet Take 1 tablet (15 mg total) by mouth daily. (Patient taking differently: Take 15 mg by mouth at bedtime. ) 30 tablet 4  . aspirin EC 81 MG tablet Take by mouth.    Marland Kitchen buPROPion (WELLBUTRIN XL) 150 MG 24 hr tablet Take 1 tablet (150 mg total) by mouth daily. 30 tablet 4  . citalopram (CELEXA) 40 MG tablet Take 1 tablet (40 mg total) by mouth daily. (Patient taking differently: Take 40 mg by mouth at bedtime. ) 30 tablet 4  . fluticasone (FLONASE) 50 MCG/ACT nasal spray Place into the nose.    . hydrochlorothiazide (HYDRODIURIL) 12.5 MG tablet Take by mouth.    . levothyroxine (SYNTHROID, LEVOTHROID) 112 MCG tablet Take by mouth daily.    . Multiple Vitamin (MULTI-VITAMINS) TABS Take 1 tablet by mouth as needed.      No current facility-administered medications for this visit.    Medical Decision Making:  Established Problem, Stable/Improving (1)  Treatment Plan Summary:Medication management   Major depressive disorder, moderate, recurrent    Continue Celexa at 40 mg daily Continue Abilify at 15 mg daily Continue Wellbutrin XL at 150 mg daily Patient reports that both regimen works well for her. Discussed with her the potential for high drug interaction between Wellbutrin and Celexa. She was warned of serotonin syndrome. Also discusses that at next visit we will begin to taper her off the Celexa and increase the Wellbutrin. Patient is agreeable to this plan. Laboratory data from the last visit were reviewed and cholesterol is at 207 which is slightly high.   Autistic spectrum disorder-stable  Patient will follow up in 3 months.     Teagen Mcleary 04/09/2015, 2:34 PM

## 2015-04-30 NOTE — H&P (Signed)
Preop visit for discussion of hysterectomy for endometrial polyps seen on [facility], with pelvic pain x several months. She is having PMB and desires definitive therapy.  EMBx 10/14/14: ENDOMETRIUM, BIOPSY:  NO HYPERPLASIA OR CARCINOMA. SCANT BENIGN ENDOMETRIAL FRAGMENTS ARE SEEN IN A SPECIMEN THAT CONSISTS MAINLY OF ENDOCERVICAL GLANDS, MUCUS, AND BLOOD.   10/22/14- Pap smear normal  Options were discussed at last visit with patient and her daughter Threasa Beards.   This surgery was attempted on 03/30/15, but after initiation of anesthesia, the patient's airway was unable to be secured and intubation was unsuccessful. She returns for this procedure on 05/04/15.  Past Medical History: has a past medical history of Arthritis; Asthma without status asthmaticus; Autism; Bipolar 1 disorder (CMS-HCC); Depression, unspecified; Hypertension; Hypothyroid, unspecified; Iron deficiency anemia; Obesity; Sleep apnea; and Suicide attempt (CMS-HCC).  Past Surgical History: has a past surgical history that includes Cholecystectomy; Endoscopic Carpal Tunnel Release (Right); Thyroidectomy Total (2010); Colonoscopy (09/27/2012); and Carpal tunnel release. Family History: family history includes Brain cancer in her mother; Cancer in her father, maternal grandfather, and paternal grandfather; Coronary artery disease in her father and paternal grandfather; Dementia in her father; Diabetes mellitus in her father; Diabetes type II in her father; Heart attack in her father and paternal grandfather; Hypertension in her father and sister; Lung cancer in her father; Prostate cancer in her father; Stroke in her father and paternal grandfather. Social History: reports that she has never smoked. She has never used smokeless tobacco. She reports that she does not drink alcohol or use illicit drugs. OB/GYN History:  OB History    Gravida Para Term Preterm AB TAB SAB Ectopic Multiple Living   3 1 1  2  2   1        Allergies: is allergic to risperdal [risperidone]; tetracycline; macrobid [nitrofurantoin monohyd/m-cryst]; and prevacid [lansoprazole]. Medications:  Current Outpatient Prescriptions:  . ABILIFY 15 mg tablet, Take 15 mg by mouth once daily., Disp: , Rfl: 0 . albuterol 90 mcg/actuation inhaler, Inhale 2 inhalations into the lungs every 6 (six) hours as needed for Wheezing., Disp: , Rfl:  . aspirin 81 MG EC tablet, Take 81 mg by mouth once daily., Disp: , Rfl:  . buPROPion (WELLBUTRIN XL) 150 MG XL tablet, Take 150 mg by mouth once daily., Disp: , Rfl: 0 . cholecalciferol (CHOLECALCIFEROL) 1,000 unit tablet, Take 1,000 Units by mouth once daily., Disp: , Rfl:  . citalopram (CELEXA) 40 MG tablet, Take 40 mg by mouth once daily., Disp: , Rfl: 0 . fluticasone (FLONASE) 50 mcg/actuation nasal spray, Place 2 sprays into both nostrils once daily., Disp: 16 g, Rfl: 3 . hydrochlorothiazide (HYDRODIURIL) 12.5 MG tablet, Take 1 tablet (12.5 mg total) by mouth once daily., Disp: 30 tablet, Rfl: 11 . levocetirizine (XYZAL) 5 MG tablet, Take 1 tablet (5 mg total) by mouth every evening., Disp: 30 tablet, Rfl: 11 . levothyroxine (SYNTHROID, LEVOTHROID) 112 MCG tablet, take 1 tablet by mouth once daily, Disp: 90 tablet, Rfl: 3 . meloxicam (MOBIC) 15 MG tablet, Take 1 tablet (15 mg total) by mouth once daily., Disp: 30 tablet, Rfl: 2 . multivitamin tablet, Take 1 tablet by mouth once daily., Disp: , Rfl:  . ondansetron (ZOFRAN) 4 MG tablet, Take 1 tablet (4 mg total) by mouth every 8 (eight) hours as needed for Nausea. (Patient not taking: Reported on 01/08/2015 ), Disp: 30 tablet, Rfl: 0  Review of Systems  Constitutional: Negative. Negative for fatigue and fever.  HENT: Negative.  Respiratory: Negative. Negative for  shortness of breath.  Cardiovascular: Negative for chest pain and palpitations.  Gastrointestinal: Negative for abdominal pain, constipation and diarrhea.  Endocrine:  Negative for cold intolerance.  Genitourinary: Positive for pelvic pain and vaginal bleeding. Negative for difficulty urinating, dyspareunia, dysuria, frequency, hematuria, menstrual problem, urgency, vaginal discharge and vaginal pain.  + Pelvic pain  Neurological: Negative for dizziness and light-headedness.  Psychiatric/Behavioral:  No changes in mood    Exam:        Visit Vitals  . BP 132/70  . Pulse 68  . Wt (!) 123.4 kg (272 lb)  . BMI 45.97 kg/m2   Physical Exam  Constitutional: She is oriented to person, place, and time. She appears well-developed and well-nourished. No distress.  Eyes: No scleral icterus.  Neck: Normal range of motion. Neck supple. No tracheal deviation present. No thyromegaly present.  Cardiovascular: Normal rate, regular rhythm and normal heart sounds.  No murmur heard. Pulmonary/Chest: Effort normal and breath sounds normal. She has no wheezes. She has no rales. Right breast exhibits no inverted nipple, no mass, no nipple discharge, no skin change and no tenderness. Left breast exhibits no inverted nipple, no mass, no nipple discharge, no skin change and no tenderness. Breasts are symmetrical.  Abdominal: She exhibits no distension and no mass. There is no tenderness. There is no rebound and no guarding.  Genitourinary:  Genitourinary Comments: Pelvic exam: External: Tanner stage 5, normal female genitalia without lesions or masses Bladder: Normal size without masses or tenderness, well-supported Urethra: No lesions or discharge with palpation. Normal urethral size and location, no prolapse Vagina: normal physiological discharge, without lesions or masses Cervix: normal without lesions or masses Adnexa: normal bimanual exam without masses or fullness Uterus: Normal size and position without masses or tenderness.  Anus/Perineum: Normal external exam  Musculoskeletal: Normal range of motion.  Lymphadenopathy:  She has  no cervical adenopathy.  Neurological: She is alert and oriented to person, place, and time.  Skin: Skin is warm and dry.  Psychiatric: She has a normal mood and affect.   No uterine prolapse. External tissue dystocia limiting exam.  Impression:   The primary encounter diagnosis was Preop examination. Diagnoses of Post-menopausal bleeding, Endometrial polyp, and Pelvic pain in female were also pertinent to this visit.    Plan:   Patient returns for a preoperative discussion regarding her plans to proceed with definitive surgical treatment of her symptomatic endometrial polyp and pelvic pain by robotic assisted total laparoscopic hysterectomy with bilateral salpingoophorectomy. I recommend robotic-assistance because of her hx of BMI 46 and prior abdominal surgeries without uterine descent. If under anesthesia a TVH is appropriate, I will perform this surgery.  Anesthsia will see the patient in preop and will plan to anticipate a difficult intubation. They believe they have the tools to be successful.  The patient and I discussed the technical aspects of the procedure including the potential for risks and complications. These include but are not limited to the risk of infection requiring post-operative antibiotics or further procedures. We talked about the risk of injury to adjacent organs including bladder, bowel, ureter, blood vessels or nerves. We talked about the need to convert to an open incision. We talked about the possible need for blood transfusion. We talked aboutpostop complications such asthromboembolic or cardiopulmonary complications. All of her questions were answered. Her preoperative exam was completed and the appropriate consents were signed. She is scheduled to undergo this procedure in the near future.

## 2015-05-04 ENCOUNTER — Ambulatory Visit: Admit: 2015-05-04 | Payer: Self-pay | Admitting: Obstetrics and Gynecology

## 2015-05-04 ENCOUNTER — Observation Stay
Admission: RE | Admit: 2015-05-04 | Discharge: 2015-05-05 | Disposition: A | Discharge status: Home / Self Care | Payer: Medicare Other | Source: Ambulatory Visit | Attending: Obstetrics and Gynecology | Admitting: Obstetrics and Gynecology

## 2015-05-04 ENCOUNTER — Encounter
Admission: RE | Disposition: A | Discharge status: Home / Self Care | Payer: Self-pay | Source: Ambulatory Visit | Attending: Obstetrics and Gynecology

## 2015-05-04 ENCOUNTER — Ambulatory Visit: Payer: Medicare Other | Admitting: Anesthesiology

## 2015-05-04 ENCOUNTER — Encounter: Payer: Self-pay | Admitting: *Deleted

## 2015-05-04 DIAGNOSIS — F309 Manic episode, unspecified: Secondary | ICD-10-CM | POA: Insufficient documentation

## 2015-05-04 DIAGNOSIS — R102 Pelvic and perineal pain: Secondary | ICD-10-CM | POA: Insufficient documentation

## 2015-05-04 DIAGNOSIS — F840 Autistic disorder: Secondary | ICD-10-CM | POA: Diagnosis not present

## 2015-05-04 DIAGNOSIS — Z8042 Family history of malignant neoplasm of prostate: Secondary | ICD-10-CM | POA: Insufficient documentation

## 2015-05-04 DIAGNOSIS — Z801 Family history of malignant neoplasm of trachea, bronchus and lung: Secondary | ICD-10-CM | POA: Insufficient documentation

## 2015-05-04 DIAGNOSIS — Z888 Allergy status to other drugs, medicaments and biological substances status: Secondary | ICD-10-CM | POA: Diagnosis not present

## 2015-05-04 DIAGNOSIS — N950 Postmenopausal bleeding: Secondary | ICD-10-CM | POA: Diagnosis not present

## 2015-05-04 DIAGNOSIS — E039 Hypothyroidism, unspecified: Secondary | ICD-10-CM | POA: Insufficient documentation

## 2015-05-04 DIAGNOSIS — D252 Subserosal leiomyoma of uterus: Secondary | ICD-10-CM | POA: Insufficient documentation

## 2015-05-04 DIAGNOSIS — Z9049 Acquired absence of other specified parts of digestive tract: Secondary | ICD-10-CM | POA: Insufficient documentation

## 2015-05-04 DIAGNOSIS — J45909 Unspecified asthma, uncomplicated: Secondary | ICD-10-CM | POA: Diagnosis not present

## 2015-05-04 DIAGNOSIS — Z6841 Body Mass Index (BMI) 40.0 and over, adult: Secondary | ICD-10-CM | POA: Diagnosis not present

## 2015-05-04 DIAGNOSIS — M1990 Unspecified osteoarthritis, unspecified site: Secondary | ICD-10-CM | POA: Diagnosis not present

## 2015-05-04 DIAGNOSIS — Z808 Family history of malignant neoplasm of other organs or systems: Secondary | ICD-10-CM | POA: Diagnosis not present

## 2015-05-04 DIAGNOSIS — D509 Iron deficiency anemia, unspecified: Secondary | ICD-10-CM | POA: Insufficient documentation

## 2015-05-04 DIAGNOSIS — I10 Essential (primary) hypertension: Secondary | ICD-10-CM | POA: Insufficient documentation

## 2015-05-04 DIAGNOSIS — D251 Intramural leiomyoma of uterus: Secondary | ICD-10-CM | POA: Insufficient documentation

## 2015-05-04 DIAGNOSIS — Z820 Family history of epilepsy and other diseases of the nervous system: Secondary | ICD-10-CM | POA: Insufficient documentation

## 2015-05-04 DIAGNOSIS — Z8249 Family history of ischemic heart disease and other diseases of the circulatory system: Secondary | ICD-10-CM | POA: Insufficient documentation

## 2015-05-04 DIAGNOSIS — G8929 Other chronic pain: Secondary | ICD-10-CM | POA: Insufficient documentation

## 2015-05-04 DIAGNOSIS — D250 Submucous leiomyoma of uterus: Secondary | ICD-10-CM | POA: Diagnosis not present

## 2015-05-04 DIAGNOSIS — G4730 Sleep apnea, unspecified: Secondary | ICD-10-CM | POA: Diagnosis not present

## 2015-05-04 DIAGNOSIS — Z881 Allergy status to other antibiotic agents status: Secondary | ICD-10-CM | POA: Insufficient documentation

## 2015-05-04 DIAGNOSIS — Z833 Family history of diabetes mellitus: Secondary | ICD-10-CM | POA: Diagnosis not present

## 2015-05-04 DIAGNOSIS — Z823 Family history of stroke: Secondary | ICD-10-CM | POA: Insufficient documentation

## 2015-05-04 HISTORY — PX: CYSTOSCOPY: SHX5120

## 2015-05-04 HISTORY — PX: ROBOTIC ASSISTED TOTAL HYSTERECTOMY WITH SALPINGECTOMY: SHX6679

## 2015-05-04 HISTORY — DX: Failed or difficult intubation, initial encounter: T88.4XXA

## 2015-05-04 LAB — CBC
HCT: 37.7 % (ref 35.0–47.0)
Hemoglobin: 12.7 g/dL (ref 12.0–16.0)
MCH: 30.2 pg (ref 26.0–34.0)
MCHC: 33.6 g/dL (ref 32.0–36.0)
MCV: 89.7 fL (ref 80.0–100.0)
PLATELETS: 231 10*3/uL (ref 150–440)
RBC: 4.2 MIL/uL (ref 3.80–5.20)
RDW: 14.6 % — ABNORMAL HIGH (ref 11.5–14.5)
WBC: 5.2 10*3/uL (ref 3.6–11.0)

## 2015-05-04 LAB — TYPE AND SCREEN
ABO/RH(D): B POS
Antibody Screen: NEGATIVE

## 2015-05-04 LAB — BASIC METABOLIC PANEL
ANION GAP: 5 (ref 5–15)
BUN: 17 mg/dL (ref 6–20)
CO2: 28 mmol/L (ref 22–32)
Calcium: 9.2 mg/dL (ref 8.9–10.3)
Chloride: 107 mmol/L (ref 101–111)
Creatinine, Ser: 0.95 mg/dL (ref 0.44–1.00)
GFR calc Af Amer: >60 mL/min (ref >60)
Glucose, Bld: 111 mg/dL — ABNORMAL HIGH (ref 65–99)
POTASSIUM: 3.7 mmol/L (ref 3.5–5.1)
SODIUM: 140 mmol/L (ref 135–145)

## 2015-05-04 SURGERY — ROBOTIC ASSISTED TOTAL HYSTERECTOMY WITH SALPINGECTOMY
Anesthesia: Choice | Laterality: Bilateral

## 2015-05-04 SURGERY — ROBOTIC ASSISTED TOTAL HYSTERECTOMY WITH SALPINGECTOMY
Anesthesia: General | Laterality: Bilateral | Wound class: Clean Contaminated

## 2015-05-04 MED ORDER — IBUPROFEN 600 MG PO TABS
600.0000 mg | ORAL_TABLET | Freq: Four times a day (QID) | ORAL | Status: DC | PRN
  Administered 2015-05-05 (×3): 600 mg via ORAL
  Filled 2015-05-04 (×3): qty 1

## 2015-05-04 MED ORDER — ONDANSETRON HCL 4 MG PO TABS: 4.0000 mg | ORAL_TABLET | Freq: Four times a day (QID) | ORAL | Status: DC | PRN

## 2015-05-04 MED ORDER — OXYCODONE HCL 5 MG PO TABS
5.0000 mg | ORAL_TABLET | Freq: Once | ORAL | Status: DC | PRN
Start: 2015-05-04 — End: 2015-05-04

## 2015-05-04 MED ORDER — ACETAMINOPHEN 10 MG/ML IV SOLN
INTRAVENOUS | Status: AC
  Filled 2015-05-04: qty 100

## 2015-05-04 MED ORDER — FENTANYL CITRATE (PF) 100 MCG/2ML IJ SOLN: 25.0000 ug | INTRAMUSCULAR | Status: DC | PRN

## 2015-05-04 MED ORDER — LACTATED RINGERS IV SOLN
INTRAVENOUS | Status: DC
  Administered 2015-05-04 (×2): via INTRAVENOUS

## 2015-05-04 MED ORDER — OXYCODONE HCL 5 MG/5ML PO SOLN: 5.0000 mg | Freq: Once | ORAL | Status: DC | PRN

## 2015-05-04 MED ORDER — FAMOTIDINE 20 MG PO TABS
20.0000 mg | ORAL_TABLET | Freq: Once | ORAL | Status: AC
  Administered 2015-05-04: 20 mg via ORAL

## 2015-05-04 MED ORDER — BUPROPION HCL ER (XL) 150 MG PO TB24
150.0000 mg | ORAL_TABLET | Freq: Every day | ORAL | Status: DC
  Administered 2015-05-05: 150 mg via ORAL
  Filled 2015-05-04 (×2): qty 1

## 2015-05-04 MED ORDER — PROPOFOL 10 MG/ML IV BOLUS
INTRAVENOUS | Status: DC | PRN
  Administered 2015-05-04: 200 mg via INTRAVENOUS

## 2015-05-04 MED ORDER — LACTATED RINGERS IV SOLN
INTRAVENOUS | Status: DC
  Administered 2015-05-04 – 2015-05-05 (×2): via INTRAVENOUS

## 2015-05-04 MED ORDER — LEVOTHYROXINE SODIUM 112 MCG PO TABS
112.0000 ug | ORAL_TABLET | Freq: Every day | ORAL | Status: DC
  Administered 2015-05-05: 112 ug via ORAL
  Filled 2015-05-04 (×2): qty 1

## 2015-05-04 MED ORDER — DEXAMETHASONE SODIUM PHOSPHATE 10 MG/ML IJ SOLN
INTRAMUSCULAR | Status: DC | PRN
  Administered 2015-05-04: 5 mg via INTRAVENOUS

## 2015-05-04 MED ORDER — KETOROLAC TROMETHAMINE 30 MG/ML IJ SOLN
INTRAMUSCULAR | Status: DC | PRN
  Administered 2015-05-04: 15 mg via INTRAVENOUS

## 2015-05-04 MED ORDER — BUPIVACAINE HCL (PF) 0.5 % IJ SOLN
INTRAMUSCULAR | Status: DC | PRN
  Administered 2015-05-04: 27 mL

## 2015-05-04 MED ORDER — KETOROLAC TROMETHAMINE 30 MG/ML IJ SOLN
30.0000 mg | Freq: Once | INTRAMUSCULAR | Status: AC
  Administered 2015-05-04: 30 mg via INTRAVENOUS
  Filled 2015-05-04: qty 1

## 2015-05-04 MED ORDER — HYDROMORPHONE HCL 1 MG/ML IJ SOLN: 0.2000 mg | INTRAMUSCULAR | Status: DC | PRN

## 2015-05-04 MED ORDER — NEOSTIGMINE METHYLSULFATE 10 MG/10ML IV SOLN
INTRAVENOUS | Status: DC | PRN
  Administered 2015-05-04 (×2): 1 mg via INTRAVENOUS

## 2015-05-04 MED ORDER — ROCURONIUM BROMIDE 100 MG/10ML IV SOLN
INTRAVENOUS | Status: DC | PRN
  Administered 2015-05-04: 20 mg via INTRAVENOUS
  Administered 2015-05-04 (×4): 10 mg via INTRAVENOUS

## 2015-05-04 MED ORDER — ADULT MULTIVITAMIN W/MINERALS CH
1.0000 | ORAL_TABLET | Freq: Once | ORAL | Status: AC
  Administered 2015-05-04: 1 via ORAL
  Filled 2015-05-04: qty 1

## 2015-05-04 MED ORDER — SUCCINYLCHOLINE CHLORIDE 20 MG/ML IJ SOLN
INTRAMUSCULAR | Status: DC | PRN
  Administered 2015-05-04: 120 mg via INTRAVENOUS

## 2015-05-04 MED ORDER — LIDOCAINE HCL (CARDIAC) 20 MG/ML IV SOLN
INTRAVENOUS | Status: DC | PRN
  Administered 2015-05-04: 100 mg via INTRAVENOUS

## 2015-05-04 MED ORDER — ALBUTEROL SULFATE (2.5 MG/3ML) 0.083% IN NEBU: 2.5000 mg | INHALATION_SOLUTION | RESPIRATORY_TRACT | Status: DC | PRN

## 2015-05-04 MED ORDER — ARIPIPRAZOLE 15 MG PO TABS
15.0000 mg | ORAL_TABLET | Freq: Every day | ORAL | Status: DC
  Administered 2015-05-04 – 2015-05-05 (×2): 15 mg via ORAL
  Filled 2015-05-04 (×2): qty 1

## 2015-05-04 MED ORDER — OXYCODONE-ACETAMINOPHEN 5-325 MG PO TABS
1.0000 | ORAL_TABLET | ORAL | Status: DC | PRN
  Administered 2015-05-04: 1 via ORAL
  Filled 2015-05-04: qty 1

## 2015-05-04 MED ORDER — CITALOPRAM HYDROBROMIDE 20 MG PO TABS
40.0000 mg | ORAL_TABLET | Freq: Every day | ORAL | Status: DC
Start: 2015-05-04 — End: 2015-05-05
  Administered 2015-05-04 – 2015-05-05 (×2): 40 mg via ORAL
  Filled 2015-05-04 (×2): qty 2

## 2015-05-04 MED ORDER — CEFAZOLIN SODIUM-DEXTROSE 2-4 GM/100ML-% IV SOLN
2.0000 g | INTRAVENOUS | Status: AC
  Administered 2015-05-04: 1 g via INTRAVENOUS
  Administered 2015-05-04: 2 g via INTRAVENOUS

## 2015-05-04 MED ORDER — FLUTICASONE PROPIONATE 50 MCG/ACT NA SUSP
1.0000 | Freq: Every day | NASAL | Status: DC
  Administered 2015-05-05: 1 via NASAL
  Filled 2015-05-04: qty 16

## 2015-05-04 MED ORDER — IPRATROPIUM-ALBUTEROL 0.5-2.5 (3) MG/3ML IN SOLN
3.0000 mL | Freq: Once | RESPIRATORY_TRACT | Status: AC
  Administered 2015-05-04: 3 mL via RESPIRATORY_TRACT

## 2015-05-04 MED ORDER — FENTANYL CITRATE (PF) 100 MCG/2ML IJ SOLN
25.0000 ug | INTRAMUSCULAR | Status: DC | PRN
  Administered 2015-05-04: 100 ug via INTRAVENOUS
  Administered 2015-05-04: 50 ug via INTRAVENOUS

## 2015-05-04 MED ORDER — LACTATED RINGERS IV SOLN: INTRAVENOUS | Status: DC

## 2015-05-04 MED ORDER — GLYCOPYRROLATE 0.2 MG/ML IJ SOLN
INTRAMUSCULAR | Status: DC | PRN
  Administered 2015-05-04: 0.4 mg via INTRAVENOUS
  Administered 2015-05-04: 0.2 mg via INTRAVENOUS

## 2015-05-04 MED ORDER — ACETAMINOPHEN 10 MG/ML IV SOLN
INTRAVENOUS | Status: DC | PRN
  Administered 2015-05-04: 1000 mg via INTRAVENOUS

## 2015-05-04 MED ORDER — IPRATROPIUM-ALBUTEROL 0.5-2.5 (3) MG/3ML IN SOLN
RESPIRATORY_TRACT | Status: AC
  Administered 2015-05-04: 3 mL via RESPIRATORY_TRACT
  Filled 2015-05-04: qty 3

## 2015-05-04 MED ORDER — HYDROCHLOROTHIAZIDE 25 MG PO TABS
12.5000 mg | ORAL_TABLET | Freq: Every day | ORAL | Status: DC
  Administered 2015-05-05: 12.5 mg via ORAL
  Filled 2015-05-04: qty 1

## 2015-05-04 MED ORDER — MENTHOL 3 MG MT LOZG
1.0000 | LOZENGE | OROMUCOSAL | Status: DC | PRN
Start: 2015-05-04 — End: 2015-05-05
  Filled 2015-05-04: qty 9

## 2015-05-04 MED ORDER — ONDANSETRON HCL 4 MG/2ML IJ SOLN
4.0000 mg | Freq: Once | INTRAMUSCULAR | Status: AC | PRN
  Administered 2015-05-04: 4 mg via INTRAVENOUS

## 2015-05-04 MED ORDER — DOCUSATE SODIUM 100 MG PO CAPS
100.0000 mg | ORAL_CAPSULE | Freq: Two times a day (BID) | ORAL | Status: DC
  Administered 2015-05-04 – 2015-05-05 (×2): 100 mg via ORAL
  Filled 2015-05-04 (×2): qty 1

## 2015-05-04 MED ORDER — ONDANSETRON HCL 4 MG/2ML IJ SOLN: 4.0000 mg | Freq: Four times a day (QID) | INTRAMUSCULAR | Status: DC | PRN

## 2015-05-04 MED ORDER — EPHEDRINE SULFATE 50 MG/ML IJ SOLN
INTRAMUSCULAR | Status: DC | PRN
  Administered 2015-05-04 (×2): 5 mg via INTRAVENOUS

## 2015-05-04 SURGICAL SUPPLY — 69 items
BAG URO DRAIN 2000ML W/SPOUT (MISCELLANEOUS) ×3 IMPLANT
BLADE SURG SZ11 CARB STEEL (BLADE) ×3 IMPLANT
CANISTER SUCT 1200ML W/VALVE (MISCELLANEOUS) ×3 IMPLANT
CATH FOLEY 2WAY  5CC 16FR (CATHETERS) ×1
CATH FOLEY 2WAY 5CC 16FR (CATHETERS) ×2
CATH URTH 16FR FL 2W BLN LF (CATHETERS) ×2 IMPLANT
CHLORAPREP W/TINT 26ML (MISCELLANEOUS) ×4 IMPLANT
CORD BIP STRL DISP 12FT (MISCELLANEOUS) ×3 IMPLANT
COVER TIP SHEARS 8 DVNC (MISCELLANEOUS) ×2 IMPLANT
COVER TIP SHEARS 8MM DA VINCI (MISCELLANEOUS) ×1
CRADLE LAMINECT ARM (MISCELLANEOUS) ×3 IMPLANT
DEFOGGER SCOPE WARMER CLEARIFY (MISCELLANEOUS) ×3 IMPLANT
DRAPE 3 ARM ACCESS DA VINCI (DRAPES) ×1
DRAPE 3 ARM ACCESS DVNC (DRAPES) ×2 IMPLANT
DRAPE CAMERA ARM DAVINCI SIROB (DRAPES) ×1 IMPLANT
DRAPE INSTRUMENT ARM DA VINCI (DRAPES) ×1
DRAPE INSTRUMENT ARM DVNC (DRAPES) IMPLANT
DRAPE LEGGINS SURG 28X43 STRL (DRAPES) ×2 IMPLANT
DRAPE SHEET LG 3/4 BI-LAMINATE (DRAPES) ×5 IMPLANT
DRAPE UNDER BUTTOCK W/FLU (DRAPES) ×2 IMPLANT
ELECT REM PT RETURN 9FT ADLT (ELECTROSURGICAL) ×3
ELECTRODE REM PT RTRN 9FT ADLT (ELECTROSURGICAL) ×2 IMPLANT
FILTER LAP SMOKE EVAC STRL (MISCELLANEOUS) ×3 IMPLANT
GLOVE BIO SURGEON STRL SZ 6.5 (GLOVE) ×13 IMPLANT
GLOVE INDICATOR 7.0 STRL GRN (GLOVE) ×13 IMPLANT
GOWN STRL REUS W/ TWL LRG LVL3 (GOWN DISPOSABLE) ×16 IMPLANT
GOWN STRL REUS W/TWL LRG LVL3 (GOWN DISPOSABLE) ×24
GRASPER SUT TROCAR 14GX15 (MISCELLANEOUS) ×3 IMPLANT
IRRIGATION STRYKERFLOW (MISCELLANEOUS) ×2 IMPLANT
IRRIGATOR STRYKERFLOW (MISCELLANEOUS) ×3
IV NS 1000ML (IV SOLUTION) ×6
IV NS 1000ML BAXH (IV SOLUTION) ×2 IMPLANT
KIT PINK PAD W/HEAD ARE REST (MISCELLANEOUS) ×3
KIT PINK PAD W/HEAD ARM REST (MISCELLANEOUS) ×2 IMPLANT
KIT RM TURNOVER CYSTO AR (KITS) ×3 IMPLANT
LABEL OR SOLS (LABEL) ×2 IMPLANT
LIQUID BAND (GAUZE/BANDAGES/DRESSINGS) ×3 IMPLANT
MANIPULATOR VCARE LG CRV RETR (MISCELLANEOUS) ×2 IMPLANT
MANIPULATOR VCARE STD CRV RETR (MISCELLANEOUS) ×3 IMPLANT
NS IRRIG 1000ML POUR BTL (IV SOLUTION) ×3 IMPLANT
OCCLUDER COLPOPNEUMO (BALLOONS) ×3 IMPLANT
PACK GYN LAPAROSCOPIC (MISCELLANEOUS) ×3 IMPLANT
PAD OB MATERNITY 4.3X12.25 (PERSONAL CARE ITEMS) ×3 IMPLANT
PAD PREP 24X41 OB/GYN DISP (PERSONAL CARE ITEMS) ×3 IMPLANT
PENCIL ELECTRO HAND CTR (MISCELLANEOUS) ×1 IMPLANT
SCISSORS METZENBAUM CVD 33 (INSTRUMENTS) ×2 IMPLANT
SET CYSTO W/LG BORE CLAMP LF (SET/KITS/TRAYS/PACK) ×3 IMPLANT
SOLUTION ELECTROLUBE (MISCELLANEOUS) ×3 IMPLANT
SURGILUBE 2OZ TUBE FLIPTOP (MISCELLANEOUS) ×3 IMPLANT
SUT DVC VLOC 180 0 12IN GS21 (SUTURE)
SUT MNCRL 4-0 (SUTURE) ×6
SUT MNCRL 4-018XMFL (SUTURE) ×4
SUT VIC AB 0 CT2 27 (SUTURE) ×2 IMPLANT
SUT VIC AB 1 CT1 36 (SUTURE) ×2 IMPLANT
SUT VIC AB 2-0 CT1 27 (SUTURE)
SUT VIC AB 2-0 CT1 TAPERPNT 27 (SUTURE) ×2 IMPLANT
SUT VIC AB 4-0 SH 27 (SUTURE)
SUT VIC AB 4-0 SH 27XANBCTRL (SUTURE) ×4 IMPLANT
SUT VICRYL 0 AB UR-6 (SUTURE) ×4 IMPLANT
SUTURE DVC VLC 180 0 12IN GS21 (SUTURE) ×2 IMPLANT
SUTURE MNCRL 4-018XMF (SUTURE) IMPLANT
SYR 50ML LL SCALE MARK (SYRINGE) ×3 IMPLANT
SYRINGE 10CC LL (SYRINGE) ×3 IMPLANT
TROCAR 12M 150ML BLUNT (TROCAR) ×2 IMPLANT
TROCAR DISP BLADELESS 8 DVNC (TROCAR) ×2 IMPLANT
TROCAR DISP BLADELESS 8MM (TROCAR) ×1
TROCAR ENDO BLADELESS 11MM (ENDOMECHANICALS) ×3 IMPLANT
TROCAR XCEL 12X100 BLDLESS (ENDOMECHANICALS) ×2 IMPLANT
TUBING INSUFFLATOR HEATED (MISCELLANEOUS) ×3 IMPLANT

## 2015-05-04 NOTE — Op Note (Signed)
Operative Note   SURGERY DATE: 05/04/2015  PRE-OP DIAGNOSIS:  1. Persistent postmenopausal bleeding 2. Chronic pelvic pain 3. Morbid obesity 4. Hx of prior abdominopelvic surgeries 5. Difficult airway   POST-OP DIAGNOSIS:  1. Same 2. Abdominopelvic adhesive disease  PROCEDURES: 1. Exam under anesthesia 2. ROBOT ASSISTED LAPAROSCOPY, SURGICAL, WITH TOTAL HYSTERECTOMY, FOR UTERUS 250 G OR LESS; WITH REMOVAL OF TUBE(S) AND / OR OVARY(S) 3. Cystoscopy  SURGEON: Angelina Pih, MD, MPH  ASSISTANT(S):  CST Doman  STAFF -  Circulator: Gustavo Lah, RN, BSN Scrub Tech: Justice Britain, CST  ANESTHESIA: General   ESTIMATED BLOOD LOSS: 25 mL  DRAINS: Foley catheter  TOTAL IV FLUIDS: 123XX123 mL  COMPLICATIONS: None  DISPOSITION: PACU - hemodynamically stable.  CONDITION: stable  Antibiotics: Ancef 3g iv within 1 hour prior to start of procedure  VTE prophylaxis: was ordered perioperatively.  Wound: Clean contaminated  IMPLANTS: None  INDICATIONS:  Persistent postmenopausal bleeding with normal pap and endometrial biopsy.  OPERATIVE FINDINGS:  Pelvic: External genitalia negative for lesions. Vagina negative. Adenxa negative for masses or nodularity. Cervix without gross lesions. Uterus mobile, large, anteverted, with fibroids apparent.  Intraoperative findings revealed a normal upper abdomen including bowel, diaphragmatic surfaces, stomach, and omentum.  The right and left ovaries appeared normal.  Bilateral tubes appeared normal.  Adhesions were noted between the omentum and anterior abdominal wall in upper abdomin The bowels were in the pelvis throughout the case. Care was taken not to grasp them or employ cautery near them at any time, but they were swept out of the operative field throughout the case and held for several hours.   PROCEDURE IN DETAIL: After informed consent was obtained, the patient was taken to the operating room where general anesthesia was  obtained without difficulty using a fiberoptic scope on the second try. The patient was positioned in the dorsal lithotomy position in [place] and her arms were carefully tucked at her sides and the usual precautions were taken.  She was prepped and draped in normal sterile fashion.  Time-out was performed and a Foley catheter was placed into the bladder. A standard VCare uterine manipulator was then placed in the uterus without incident.  Preoperative prophylactic antibiotics were given through her iv.  After infiltration of local anesthetic at the proposed trocar sites, an 8 mm incision was created infraumbilically. A Veress needle was carefully introduced with the gas on low-flow into Palmer's point after placement of an OG tube, and entry into the peritoneal cavity was assured with a low pressure noted. Pneumoperitoneum was created to a pressure of 15 mm Hg. The camera port was placed and the abdomin surveyed, noting intact bowel below the site of entry. A survey of the pelvis and upper abdomen revealed the above findings. Right and left lateral 8-mm robotic ports and a 5-12 mm right-sided assistant port were placed under direct visualization.  The patient was placed in deepTrendelenburg and the bowel was displaced up into the upper abdomen. The robot was left side docked. The instruments were placed under direct visualization.   The ureters were identified bilaterally coursing outside of the operative field. Round ligaments were divided on each side with the EndoShears and the retroperitoneal space was opened bilaterally. The posterior leaflet of the broad was taken down to the level of the IP ligament. The anterior leaflet of the broad ligament was carefully taken down to the midline.  A bladder flap was created and the bladder was dissected down off the lower  uterine segment and cervix using endoshears and electrocautery.   Ovariolysis was performed and the ovary was dissected medially with  care to avoid the ureter.  The infundibulopelvic ligaments were skeletonized, sealed and divided. On the right side, the adnexa was separated from the uterus to allow good visualization.The peritoneum was taken down to the level of the internal os, and the uterine arteries skeletonized. With strong cephalad pressure from the V-care, bipolar cautery was used to seal and transect the uterine arteries, and the pedicles allowed to fall away laterally.  A colpotomy was performed circumferentially along the V-Care ring with monopolar electrocautery and the cervix was incised from the vagina. The specimen was removed through the vagina.  A pneumo balloon was placed in the vagina and the vaginal cuff was then closed in a running continuous fashion using the  0 V-Lock suture with careful attention to include the vaginal cuff angles and the vaginal mucosa within the closure.  Hemostasis was secured with suction-irrigation.The intraperitoneal pressure was dropped, and all planes of dissection, vascular pedicles and the vaginal cuff were found to be hemostatic.  The robot was undocked. The assistant port trocar was removed and the fascia was closed with 0 Vicryl suture. The lateral trocars were removed under visualization.  The CO2 gas was released and several deep breaths given to remove any remaining CO2 from the peritoneal cavity.  The skin incisions were closed with 4-0 Monocryl subcuticular stitch and pressure dressings placed.   The Foley catheter was temporarily removed and an uncomplicated cystoscopy was performed. Excellent efflux was noted from both ureteral orifices. The bladder mucosa was intact. The Foley catheter was replaced for postoperative care.   Anesthesia was reversed without difficulty.  The patient tolerated the procedure well.  Sponge, lap and needle counts were correct x2.  The patient was taken to recovery room in excellent condition.  2

## 2015-05-04 NOTE — Discharge Instructions (Signed)
Discharge instructions after  robotically-assisted total laparoscopic hysterectomy  Signs and Symptoms to Report Call our office at [phone] if you have any of the following.   Fever over 100.4 degrees or higher  Severe stomach pain not relieved with pain medications  Bright red bleeding thats heavier than a period that does not slow with rest  To go the bathroom a lot (frequency), you cant hold your urine (urgency), or it hurts when you empty your bladder (urinate)  Chest pain  Shortness of breath  Pain in the calves of your legs  Severe nausea and vomiting not relieved with anti-nausea medications  Signs of infection around your wounds, such as redness, hot to touch, swelling, green/yellow drainage (like pus), bad smelling discharge  Any concerns  What You Can Expect after Surgery  You may see some pink tinged, bloody fluid and bruising around the wound. This is normal.  You may notice shoulder and neck pain. This is caused by the gas used during surgery to expand your abdomen so your surgeon could get to the uterus easier.  You may have a sore throat because of the tube in your mouth during general anesthesia. This will go away in 2 to 3 days.  You may have some stomach cramps.  You may notice spotting on your panties.  You may have pain around the incision sites.   Activities after Your Discharge Follow these guidelines to help speed your recovery at home:  Do the coughing and deep breathing as you did in the hospital for 2 weeks. Use the small blue breathing device, called the incentive spirometer for 2 weeks.  Dont drive if you are in pain or taking narcotic pain medicine. You may drive when you can safely slam on the brakes, turn the wheel forcefully, and rotate your torso comfortably. This is typically 1-2 weeks. Practice in a parking lot or side street prior to attempting to drive regularly.   Ask others to help with household chores for 4 weeks.   Do not lift anything heavier that 10 pounds for 4-6 weeks. This includes pets, children, and groceries.  Dont do strenuous activities, exercises, or sports like vacuuming, tennis, squash, etc. until your doctor says it is safe to do so. ---Maintain pelvic rest for 12 weeks. This means nothing in the vagina at all (no douching, tampons, intercourse) for 12 weeks.   Walk as you feel able. Rest often since it may take two or three weeks for your energy level to return to normal.   You may climb stairs  Avoid constipation:   -Eat fruits, vegetables, and whole grains. Eat small meals as your appetite will take time to return to normal.   -Drink 6 to 8 glasses of water each day unless your doctor has told you to limit your fluids.   -Use a laxative or stool softener as needed if constipation becomes a problem. You may take Miralax, metamucil, Citrucil, Colace, Senekot, FiberCon, etc. If this does not relieve the constipation, try two tablespoons of Milk Of Magnesia every 8 hours until your bowels move.   You may shower. Gently wash the wounds with a mild soap and water. Pat dry.  Do not get in a hot tub, swimming pool, etc. for 6 weeks.  Do not use lotions, oils, powders on the wounds.  Do not douche, use tampons, or have sex until your doctor says it is okay.  Take your pain medicine when you need it. The medicine may not work  as well if the pain is bad. ° °Take the medicines you were taking before surgery. Other medications you will need are pain medications (Norco or Percocet) and nausea medications (Zofran).  ° ° °

## 2015-05-04 NOTE — Progress Notes (Signed)
Day of Surgery Procedure(s) (LRB): ROBOTIC ASSISTED TOTAL HYSTERECTOMY WITH BILATERAL SALPINGOOPHERECTOMY (Bilateral) CYSTOSCOPY  Subjective: Patient reports tolerating PO.  Pain controlled. No nausea  Objective: I have reviewed patient's vital signs and intake and output.  General: alert, cooperative and no distress Resp: clear to auscultation bilaterally Cardio: regular rate and rhythm, S1, S2 normal, no murmur, click, rub or gallop GI: soft, appropriately tender; bowel sounds absent; incisions c/d/i Extremities: extremities normal, atraumatic, no cyanosis or edema  Assessment: s/p Procedure(s): ROBOTIC ASSISTED TOTAL HYSTERECTOMY WITH BILATERAL SALPINGOOPHERECTOMY (Bilateral) CYSTOSCOPY: stable  Plan: Continue foley due to urinary output monitoring. D/c foley in am POD#1 Clear liquids - may advance diet as tolerated. Anticipate slow bowel return IVF until full po diet. Pain meds prn     Benjaman Kindler 05/04/2015, 6:58 PM

## 2015-05-04 NOTE — Transfer of Care (Signed)
Immediate Anesthesia Transfer of Care Note  Patient: Marissa Quinn  Procedure(s) Performed: Procedure(s): ROBOTIC ASSISTED TOTAL HYSTERECTOMY WITH BILATERAL SALPINGOOPHERECTOMY (Bilateral) CYSTOSCOPY  Patient Location: PACU  Anesthesia Type:General  Level of Consciousness: awake  Airway & Oxygen Therapy: Patient connected to nasal cannula oxygen  Post-op Assessment: Report given to RN  Post vital signs: stable  Last Vitals:  Filed Vitals:   05/04/15 1432 05/04/15 1447  BP: 129/78 124/97  Pulse: 79 76  Temp: 36.3 C   Resp: 13 21    Complications: No apparent anesthesia complications  Past Medical History  Diagnosis Date  . Anxiety   . Depression   . Thyroid disease   . Sleep apnea   . Environmental allergies   . Hypothyroidism   . Asthma   . Difficult intubation    Past Surgical History  Procedure Laterality Date  . Thyroidectomy      total  . Thyroid surgery    . Gallbladder surgery    . Neck surgery     Scheduled Meds:  Continuous Infusions: . lactated ringers 125 mL/hr at 05/04/15 0924  . lactated ringers     PRN Meds:.fentaNYL (SUBLIMAZE) injection, fentaNYL (SUBLIMAZE) injection, oxyCODONE **OR** oxyCODONE

## 2015-05-04 NOTE — Anesthesia Preprocedure Evaluation (Addendum)
Anesthesia Evaluation  Patient identified by MRN, date of birth, ID band Patient awake    Reviewed: Allergy & Precautions, H&P , NPO status , Patient's Chart, lab work & pertinent test results  History of Anesthesia Complications (+) DIFFICULT AIRWAY and history of anesthetic complications  Airway Mallampati: III  TM Distance: >3 FB Neck ROM: limited    Dental  (+) Poor Dentition   Pulmonary neg shortness of breath, asthma , sleep apnea ,    Pulmonary exam normal breath sounds clear to auscultation       Cardiovascular Exercise Tolerance: Good hypertension, (-) angina(-) Past MI and (-) DOE Normal cardiovascular exam Rhythm:regular Rate:Normal     Neuro/Psych PSYCHIATRIC DISORDERS Anxiety Depression  Neuromuscular disease    GI/Hepatic negative GI ROS, Neg liver ROS,   Endo/Other  Hypothyroidism   Renal/GU negative Renal ROS  negative genitourinary   Musculoskeletal   Abdominal   Peds  Hematology negative hematology ROS (+)   Anesthesia Other Findings Past Medical History:   Anxiety                                                      Depression                                                   Thyroid disease                                              Sleep apnea                                                  Environmental allergies                                      Hypothyroidism                                               Asthma                                                      Past Surgical History:   THYROIDECTOMY                                                   Comment:total   THYROID SURGERY  GALLBLADDER SURGERY                                           NECK SURGERY                                                 BMI    Body Mass Index   47.00 kg/m 2      Reproductive/Obstetrics negative OB ROS                             Anesthesia Physical Anesthesia Plan  ASA: III  Anesthesia Plan: General ETT   Post-op Pain Management:    Induction: Intravenous  Airway Management Planned: Video Laryngoscope Planned, Fiberoptic Intubation Planned and Oral ETT  Additional Equipment: Arterial line  Intra-op Plan:   Post-operative Plan:   Informed Consent: I have reviewed the patients History and Physical, chart, labs and discussed the procedure including the risks, benefits and alternatives for the proposed anesthesia with the patient or authorized representative who has indicated his/her understanding and acceptance.   Dental Advisory Given  Plan Discussed with: Anesthesiologist, CRNA and Surgeon  Anesthesia Plan Comments: (Difficult intubation with prior anesthetic.  By report, patient maskable and team was able to visualize vocal cords with glidescope.  Plan to position patient appropriately, start with glidescope and this use fiberoptic prn.)      Anesthesia Quick Evaluation

## 2015-05-04 NOTE — Interval H&P Note (Signed)
History and Physical Interval Note:  05/04/2015 9:10 AM  Marissa Quinn  has presented today for surgery, with the diagnosis of pelvic pain, endometrial polyp  The various methods of treatment have been discussed with the patient and family. After consideration of risks, benefits and other options for treatment, the patient has consented to  Procedure(s): ROBOTIC ASSISTED TOTAL HYSTERECTOMY WITH SALPINGECTOMY (Bilateral) as a surgical intervention .  The patient's history has been reviewed, patient examined, no change in status, stable for surgery.  I have reviewed the patient's chart and labs.  Questions were answered to the patient's satisfaction.     Benjaman Kindler

## 2015-05-04 NOTE — Anesthesia Postprocedure Evaluation (Signed)
Anesthesia Post Note  Patient: Marissa Quinn  Procedure(s) Performed: Procedure(s) (LRB): ROBOTIC ASSISTED TOTAL HYSTERECTOMY WITH BILATERAL SALPINGOOPHERECTOMY (Bilateral) CYSTOSCOPY  Patient location during evaluation: PACU Anesthesia Type: General Level of consciousness: awake and alert Pain management: pain level controlled Vital Signs Assessment: post-procedure vital signs reviewed and stable Respiratory status: spontaneous breathing, nonlabored ventilation, respiratory function stable and patient connected to nasal cannula oxygen Cardiovascular status: blood pressure returned to baseline and stable Postop Assessment: no signs of nausea or vomiting Anesthetic complications: no    Last Vitals:  Filed Vitals:   05/04/15 1432 05/04/15 1447  BP: 129/78 124/97  Pulse: 79 76  Temp: 36.3 C   Resp: 13 21    Last Pain:  Filed Vitals:   05/04/15 1449  PainSc: 0-No pain                 Wilmer Floor

## 2015-05-04 NOTE — Anesthesia Procedure Notes (Addendum)
Procedure Name: Intubation Date/Time: 05/04/2015 9:38 AM Performed by: Zetta Bills Pre-anesthesia Checklist: Patient identified, Emergency Drugs available, Suction available, Patient being monitored and Timeout performed Patient Re-evaluated:Patient Re-evaluated prior to inductionOxygen Delivery Method: Circle system utilized Preoxygenation: Pre-oxygenation with 100% oxygen Intubation Type: IV induction Ventilation: Oral airway inserted - appropriate to patient size and Two handed mask ventilation required Laryngoscope Size: 3 and Glidescope Grade View: Grade I Tube type: Oral Tube size: 6.0 mm Number of attempts: 1 Airway Equipment and Method: Patient positioned with wedge pillow,  Oral airway,  Rigid stylet and Video-laryngoscopy Placement Confirmation: ETT inserted through vocal cords under direct vision,  positive ETCO2 and breath sounds checked- equal and bilateral Secured at: 21 cm Tube secured with: Tape Dental Injury: Teeth and Oropharynx as per pre-operative assessment  Difficulty Due To: Difficulty was anticipated, Difficult Airway- due to reduced neck mobility, Difficult Airway- due to anterior larynx and Difficult Airway- due to large tongue Future Recommendations: Recommend- induction with short-acting agent, and alternative techniques readily available

## 2015-05-05 ENCOUNTER — Encounter: Payer: Self-pay | Admitting: Obstetrics and Gynecology

## 2015-05-05 DIAGNOSIS — D250 Submucous leiomyoma of uterus: Secondary | ICD-10-CM | POA: Diagnosis not present

## 2015-05-05 LAB — BASIC METABOLIC PANEL
ANION GAP: 5 (ref 5–15)
BUN: 11 mg/dL (ref 6–20)
CALCIUM: 8.2 mg/dL — AB (ref 8.9–10.3)
CO2: 25 mmol/L (ref 22–32)
Chloride: 103 mmol/L (ref 101–111)
Creatinine, Ser: 1.02 mg/dL — ABNORMAL HIGH (ref 0.44–1.00)
GFR, EST NON AFRICAN AMERICAN: 60 mL/min — AB (ref >60)
GLUCOSE: 108 mg/dL — AB (ref 65–99)
Potassium: 3.6 mmol/L (ref 3.5–5.1)
Sodium: 133 mmol/L — ABNORMAL LOW (ref 135–145)

## 2015-05-05 LAB — CBC
HCT: 32.8 % — ABNORMAL LOW (ref 35.0–47.0)
Hemoglobin: 11 g/dL — ABNORMAL LOW (ref 12.0–16.0)
MCH: 30.3 pg (ref 26.0–34.0)
MCHC: 33.6 g/dL (ref 32.0–36.0)
MCV: 90.1 fL (ref 80.0–100.0)
PLATELETS: 201 10*3/uL (ref 150–440)
RBC: 3.64 MIL/uL — ABNORMAL LOW (ref 3.80–5.20)
RDW: 15.1 % — AB (ref 11.5–14.5)
WBC: 8.9 10*3/uL (ref 3.6–11.0)

## 2015-05-05 MED ORDER — IBUPROFEN 600 MG PO TABS: 600.0000 mg | ORAL_TABLET | Freq: Four times a day (QID) | ORAL | Status: DC | PRN

## 2015-05-05 MED ORDER — OXYCODONE-ACETAMINOPHEN 5-325 MG PO TABS: 1.0000 | ORAL_TABLET | ORAL | Status: DC | PRN

## 2015-05-05 MED ORDER — MENTHOL 3 MG MT LOZG: 1.0000 | LOZENGE | OROMUCOSAL | Status: DC | PRN

## 2015-05-05 MED ORDER — DOCUSATE SODIUM 100 MG PO CAPS: 100.0000 mg | ORAL_CAPSULE | Freq: Two times a day (BID) | ORAL | Status: DC

## 2015-05-05 MED ORDER — ONDANSETRON HCL 4 MG PO TABS: 4.0000 mg | ORAL_TABLET | Freq: Four times a day (QID) | ORAL | Status: DC | PRN

## 2015-05-05 NOTE — Discharge Summary (Signed)
Physician Discharge Summary  Patient ID: Marissa Quinn MRN: [id number] DOB/AGE: 1955-10-20 60 y.o.  Admit date: 05/04/2015 Discharge date: 05/05/2015  Admission Diagnoses:  Discharge Diagnoses:  Active Problems:   Postmenopausal bleeding   Discharged Condition: good  Hospital Course: Admitted from overnight monitoring after robotically assisted total laparoscopic hysterectomy with BSO for pelvic pain. On POD#1, pt was ambulating without assistance, tolerating regular PO diet, voiding spontaneously, and her pain was controlled with oral medications.  Consults: None  Significant Diagnostic Studies: none  Treatments: surgery: see above  Discharge Exam: Blood pressure 99/50, pulse 73, temperature 99 F (37.2 C), temperature source Oral, resp. rate 18, height 5\' 4"  (1.626 m), weight 124.286 kg (274 lb), last menstrual period 03/24/2011, SpO2 92 %. General appearance: alert, cooperative and no distress Resp: clear to auscultation bilaterally Cardio: regular rate and rhythm, S1, S2 normal, no murmur, click, rub or gallop GI: soft, non-tender; bowel sounds normal; no masses,  no organomegaly Extremities: extremities normal, atraumatic, no cyanosis or edema Pulses: 2+ and symmetric Skin: Skin color, texture, turgor normal. No rashes or lesions Neurologic: Grossly normal  Disposition: 01-Home or Self Care     Medication List    TAKE these medications        albuterol 108 (90 Base) MCG/ACT inhaler  Commonly known as:  PROVENTIL HFA;VENTOLIN HFA  Inhale 1 puff into the lungs every 4 (four) hours as needed.     ARIPiprazole 15 MG tablet  Commonly known as:  ABILIFY  Take 1 tablet (15 mg total) by mouth daily.     aspirin EC 81 MG tablet  Take by mouth.     buPROPion 150 MG 24 hr tablet  Commonly known as:  WELLBUTRIN XL  Take 1 tablet (150 mg total) by mouth daily.     citalopram 40 MG tablet  Commonly known as:  CELEXA  Take 1 tablet (40 mg total) by mouth daily.     docusate sodium 100 MG capsule  Commonly known as:  COLACE  Take 1 capsule (100 mg total) by mouth 2 (two) times daily.     fluticasone 50 MCG/ACT nasal spray  Commonly known as:  FLONASE  Place into the nose.     hydrochlorothiazide 12.5 MG tablet  Commonly known as:  HYDRODIURIL  Take by mouth.     ibuprofen 600 MG tablet  Commonly known as:  ADVIL,MOTRIN  Take 1 tablet (600 mg total) by mouth every 6 (six) hours as needed (mild pain).     levocetirizine 5 MG tablet  Commonly known as:  XYZAL  Take 1 tablet by mouth every evening.     levothyroxine 112 MCG tablet  Commonly known as:  SYNTHROID, LEVOTHROID  Take by mouth daily.     menthol-cetylpyridinium 3 MG lozenge  Commonly known as:  CEPACOL  Take 1 lozenge (3 mg total) by mouth every 2 (two) hours as needed for sore throat.     MULTI-VITAMINS Tabs  Take 1 tablet by mouth as needed.     ondansetron 4 MG tablet  Commonly known as:  ZOFRAN  Take 1 tablet (4 mg total) by mouth every 6 (six) hours as needed for nausea.     oxyCODONE-acetaminophen 5-325 MG tablet  Commonly known as:  PERCOCET/ROXICET  Take 1-2 tablets by mouth every 4 (four) hours as needed (moderate to severe pain (when tolerating fluids)).           Follow-up Information    Follow up with Benjaman Kindler, MD  In 2 weeks.   Specialty:  Obstetrics and Gynecology   Why:  For postop check   Contact information:   [address] [place] [address] [phone]       Signed: Benjaman Kindler 05/05/2015, 8:56 AM

## 2015-05-05 NOTE — Progress Notes (Signed)
Patient discharged home with daughter. Discharge instructions, prescriptions and follow up appointment given to and reviewed with patient and daughter. Patient verbalized understanding. Escorted out via wheelchair by auxillary.

## 2015-05-06 LAB — SURGICAL PATHOLOGY

## 2015-06-26 ENCOUNTER — Encounter: Payer: Self-pay | Admitting: Obstetrics and Gynecology

## 2015-07-08 ENCOUNTER — Ambulatory Visit (INDEPENDENT_AMBULATORY_CARE_PROVIDER_SITE_OTHER): Payer: Medicare Other | Admitting: Psychiatry

## 2015-07-08 ENCOUNTER — Encounter: Payer: Self-pay | Admitting: Psychiatry

## 2015-07-08 VITALS — BP 122/78 | HR 75 | Temp 98.4°F | Ht 64.0 in | Wt 261.8 lb

## 2015-07-08 DIAGNOSIS — F840 Autistic disorder: Secondary | ICD-10-CM | POA: Diagnosis not present

## 2015-07-08 DIAGNOSIS — F4310 Post-traumatic stress disorder, unspecified: Secondary | ICD-10-CM

## 2015-07-08 DIAGNOSIS — F331 Major depressive disorder, recurrent, moderate: Secondary | ICD-10-CM | POA: Diagnosis not present

## 2015-07-08 MED ORDER — CITALOPRAM HYDROBROMIDE 20 MG PO TABS: ORAL_TABLET | ORAL | Status: DC

## 2015-07-08 NOTE — Progress Notes (Signed)
Patient ID: Marissa Quinn, female   DOB: 1955/12/15, 60 y.o.   MRN: [id number]  [facility] MD/PA/NP OP Progress Note  07/08/2015 2:35 PM MAGDELINE BOXBERGER  MRN:  [id number]  Subjective:  Patient returns for follow-up for major depressive disorder, moderate, recurrent in autistic spectrum disorder. States she is doing well moodwise. She is enjoying going to church and listening to [employer]. 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 States that she's been eating well and sleeps okay. States that she exercises a lot. Today she reports a resting tremor of both hands. States that she's noticed it recently. States that it does affect her when she is eating or doing some activities sometimes. Discussed with patient the interactions between Celexa and Wellbutrin and also the interactions between Celexa and Abilify that could lead to these tremors. We discussed changing her medication and tapering her off the Celexa. Patient is agreeable to this plan.    Chief Complaint: better Chief Complaint    Follow-up; Medication Refill     Visit Diagnosis:     ICD-9-CM ICD-10-CM   1. Major depressive disorder, recurrent episode, moderate (HCC) 296.32 F33.1   2. Autism 299.00 F84.0   3. PTSD (post-traumatic stress disorder) 309.81 F43.10     Past Medical History:  Past Medical History  Diagnosis Date  . Anxiety   . Depression   . Thyroid disease   . Sleep apnea   . Environmental allergies   . Hypothyroidism   . Asthma   . Difficult intubation     Past Surgical History  Procedure Laterality Date  . Thyroidectomy      total  . Thyroid surgery    . Gallbladder surgery    . Neck surgery    . Robotic assisted total hysterectomy with salpingectomy Bilateral 05/04/2015    Procedure: ROBOTIC ASSISTED TOTAL HYSTERECTOMY WITH BILATERAL SALPINGOOPHERECTOMY;  Surgeon: Benjaman Kindler, MD;  Location: [facility];  Service: Gynecology;  Laterality: Bilateral;  . Cystoscopy  05/04/2015     Procedure: CYSTOSCOPY;  Surgeon: Benjaman Kindler, MD;  Location: [facility];  Service: Gynecology;;  . Robotic assisted laparoscopic hysterectomy and salpingectomy Bilateral 03/30/2015    Procedure: ROBOTIC ASSISTED LAPAROSCOPIC HYSTERECTOMY AND SALPINGECTOMY;  Surgeon: Benjaman Kindler, MD;  Location: [facility];  Service: Gynecology;  Laterality: Bilateral;   Family History:  Family History  Problem Relation Age of Onset  . Brain cancer Mother   . Hypertension Brother   . Depression Paternal Grandmother   . Dementia Father   . Alcohol abuse Father   . Depression Father   . Depression Daughter   . Breast cancer Neg Hx    Social History:  Social History   Social History  . Marital Status: Divorced    Spouse Name: N/A  . Number of Children: N/A  . Years of Education: N/A   Social History Main Topics  . Smoking status: Never Smoker   . Smokeless tobacco: Never Used  . Alcohol Use: No     Comment: Occasionally   . Drug Use: No  . Sexual Activity: No   Other Topics Concern  . None   Social History Narrative   Additional History:   Assessment:   Musculoskeletal: Strength & Muscle Tone: within normal limits Gait & Station: normal Patient leans: N/A  Psychiatric Specialty Exam: HPI  Review of Systems  Psychiatric/Behavioral: Negative for depression, suicidal ideas, hallucinations, memory loss and substance abuse. The patient is not nervous/anxious and does not have  insomnia.   All other systems reviewed and are negative.   Blood pressure 122/78, pulse 75, temperature 98.4 F (36.9 C), temperature source Tympanic, height 5\' 4"  (1.626 m), weight 261 lb 12.8 oz (118.752 kg), last menstrual period 03/24/2011, SpO2 91 %.Body mass index is 44.92 kg/(m^2).  General Appearance: Well Groomed  Eye Contact:  Good  Speech:  Clear and Coherent and Slow  Volume:  Normal  Mood:  Good  Affect:  Blunt  Thought Process:  Generally appropriate/linear but slightly impoverished   Orientation:  Full (Time, Place, and Person)  Thought Content:  Negative  Suicidal Thoughts:  No  Homicidal Thoughts:  No  Memory:  Immediate;   Good Recent;   Good Remote;   Good  Judgement:  Good  Insight:  Fair  Psychomotor Activity:  Negative, tremor of both hands at resting   Concentration:  Fair  Recall:  Good  Fund of Knowledge: Fair  Language: Good  Akathisia:  Negative  Handed:  Right unknown  AIMS (if indicated):  Done 07/08/2015 and normal  Assets:  Desire for Improvement  ADL's:  Intact  Cognition: WNL  Sleep:  Five hours per night, uses CPAP   Is the patient at risk to self?  No. Has the patient been a risk to self in the past 6 months?  No. Has the patient been a risk to self within the distant past?  No. Is the patient a risk to others?  No. Has the patient been a risk to others in the past 6 months?  No. Has the patient been a risk to others within the distant past?  No.  Current Medications: Current Outpatient Prescriptions  Medication Sig Dispense Refill  . albuterol (PROVENTIL HFA;VENTOLIN HFA) 108 (90 BASE) MCG/ACT inhaler Inhale 1 puff into the lungs every 4 (four) hours as needed.     . ARIPiprazole (ABILIFY) 15 MG tablet Take 1 tablet (15 mg total) by mouth daily. 30 tablet 4  . aspirin EC 81 MG tablet Take by mouth.    Marland Kitchen buPROPion (WELLBUTRIN XL) 150 MG 24 hr tablet Take 1 tablet (150 mg total) by mouth daily. 30 tablet 4  . citalopram (CELEXA) 40 MG tablet Take 1 tablet (40 mg total) by mouth daily. 30 tablet 4  . fluticasone (FLONASE) 50 MCG/ACT nasal spray Place into the nose.    . hydrochlorothiazide (HYDRODIURIL) 12.5 MG tablet   1  . levothyroxine (SYNTHROID, LEVOTHROID) 112 MCG tablet Take by mouth daily.    . Multiple Vitamin (MULTI-VITAMINS) TABS Take 1 tablet by mouth as needed.     . hydrochlorothiazide (HYDRODIURIL) 12.5 MG tablet Take by mouth.     No current facility-administered medications for this visit.    Medical Decision  Making:  Established Problem, Stable/Improving (1)  Treatment Plan Summary:Medication management   Major depressive disorder, moderate, recurrent    Decrease Celexa to 20 mg daily for 1 week and then decrease to 10mg  for 1 week and then discontinue. Continue Abilify at 15 mg daily Continue Wellbutrin XL at 150 mg daily We will monitor how patient does off the Celexa and then consider increasing the Wellbutrin to address any mood symptoms. Autistic spectrum disorder-stable  Patient will follow up in 3 weeks and to call before if necessary.    Jaamal Farooqui 07/08/2015, 2:35 PM

## 2015-08-04 DIAGNOSIS — J3089 Other allergic rhinitis: Secondary | ICD-10-CM | POA: Insufficient documentation

## 2015-08-05 ENCOUNTER — Ambulatory Visit (INDEPENDENT_AMBULATORY_CARE_PROVIDER_SITE_OTHER): Payer: Medicare Other | Admitting: Psychiatry

## 2015-08-05 DIAGNOSIS — F331 Major depressive disorder, recurrent, moderate: Secondary | ICD-10-CM

## 2015-08-05 DIAGNOSIS — F4310 Post-traumatic stress disorder, unspecified: Secondary | ICD-10-CM | POA: Diagnosis not present

## 2015-08-05 DIAGNOSIS — F840 Autistic disorder: Secondary | ICD-10-CM

## 2015-08-05 MED ORDER — ARIPIPRAZOLE 15 MG PO TABS: 15.0000 mg | ORAL_TABLET | Freq: Every day | ORAL | Status: DC

## 2015-08-05 MED ORDER — BUPROPION HCL ER (XL) 150 MG PO TB24: 150.0000 mg | ORAL_TABLET | Freq: Every day | ORAL | Status: DC

## 2015-08-05 NOTE — Progress Notes (Signed)
Patient ID: Marissa Quinn, female   DOB: 24-Dec-1955, 60 y.o.   MRN: [id number]  [facility] MD/PA/NP OP Progress Note  08/05/2015 1:56 PM Marissa Quinn  MRN:  [id number]  Subjective:  Patient returns for follow-up for major depressive disorder, moderate, recurrent in autistic spectrum disorder. States she is doing well moodwise. She reports being able to taper off the Celexa without any problems. States her tremors of both hands are mostly gone as well. States she has more energy now, has lost 20 lbs, has been exercising regularly. She is enjoying going to church and listening to [employer]. 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  Chief Complaint: better  Visit Diagnosis:     ICD-9-CM ICD-10-CM   1. Major depressive disorder, recurrent episode, moderate (HCC) 296.32 F33.1   2. Autism 299.00 F84.0   3. PTSD (post-traumatic stress disorder) 309.81 F43.10     Past Medical History:  Past Medical History  Diagnosis Date  . Anxiety   . Depression   . Thyroid disease   . Sleep apnea   . Environmental allergies   . Hypothyroidism   . Asthma   . Difficult intubation     Past Surgical History  Procedure Laterality Date  . Thyroidectomy      total  . Thyroid surgery    . Gallbladder surgery    . Neck surgery    . Robotic assisted total hysterectomy with salpingectomy Bilateral 05/04/2015    Procedure: ROBOTIC ASSISTED TOTAL HYSTERECTOMY WITH BILATERAL SALPINGOOPHERECTOMY;  Surgeon: Benjaman Kindler, MD;  Location: [facility];  Service: Gynecology;  Laterality: Bilateral;  . Cystoscopy  05/04/2015    Procedure: CYSTOSCOPY;  Surgeon: Benjaman Kindler, MD;  Location: [facility];  Service: Gynecology;;  . Robotic assisted laparoscopic hysterectomy and salpingectomy Bilateral 03/30/2015    Procedure: ROBOTIC ASSISTED LAPAROSCOPIC HYSTERECTOMY AND SALPINGECTOMY;  Surgeon: Benjaman Kindler, MD;  Location:  [facility];  Service: Gynecology;  Laterality: Bilateral;   Family History:  Family History  Problem Relation Age of Onset  . Brain cancer Mother   . Hypertension Brother   . Depression Paternal Grandmother   . Dementia Father   . Alcohol abuse Father   . Depression Father   . Depression Daughter   . Breast cancer Neg Hx    Social History:  Social History   Social History  . Marital Status: Divorced    Spouse Name: N/A  . Number of Children: N/A  . Years of Education: N/A   Social History Main Topics  . Smoking status: Never Smoker   . Smokeless tobacco: Never Used  . Alcohol Use: No     Comment: Occasionally   . Drug Use: No  . Sexual Activity: No   Other Topics Concern  . Not on file   Social History Narrative   Additional History:   Assessment:   Musculoskeletal: Strength & Muscle Tone: within normal limits Gait & Station: normal Patient leans: N/A  Psychiatric Specialty Exam: HPI  Review of Systems  Psychiatric/Behavioral: Negative for depression, suicidal ideas, hallucinations, memory loss and substance abuse. The patient is not nervous/anxious and does not have insomnia.   All other systems reviewed and are negative.   Last menstrual period 03/24/2011.There is no weight on file to calculate BMI.  General Appearance: Well Groomed  Eye Contact:  Good  Speech:  Clear and Coherent and Slow  Volume:  Normal  Mood:  Good  Affect:  Blunt  Thought Process:  Generally appropriate/linear but slightly impoverished  Orientation:  Full (Time, Place, and Person)  Thought Content:  Negative  Suicidal Thoughts:  No  Homicidal Thoughts:  No  Memory:  Immediate;   Good Recent;   Good Remote;   Good  Judgement:  Good  Insight:  Fair  Psychomotor Activity:  Negative  Concentration:  Fair  Recall:  Good  Fund of Knowledge: Fair  Language: Good  Akathisia:  Negative  Handed:  Right unknown  AIMS (if indicated):  Done 07/08/2015 and normal  Assets:  Desire for  Improvement  ADL's:  Intact  Cognition: WNL  Sleep:  Five hours per night, uses CPAP   Is the patient at risk to self?  No. Has the patient been a risk to self in the past 6 months?  No. Has the patient been a risk to self within the distant past?  No. Is the patient a risk to others?  No. Has the patient been a risk to others in the past 6 months?  No. Has the patient been a risk to others within the distant past?  No.  Current Medications: Current Outpatient Prescriptions  Medication Sig Dispense Refill  . albuterol (PROVENTIL HFA;VENTOLIN HFA) 108 (90 BASE) MCG/ACT inhaler Inhale 1 puff into the lungs every 4 (four) hours as needed.     . ARIPiprazole (ABILIFY) 15 MG tablet Take 1 tablet (15 mg total) by mouth daily. 30 tablet 4  . aspirin EC 81 MG tablet Take by mouth.    Marland Kitchen buPROPion (WELLBUTRIN XL) 150 MG 24 hr tablet Take 1 tablet (150 mg total) by mouth daily. 30 tablet 4  . citalopram (CELEXA) 20 MG tablet Take 1 tablet by mouth daily for 1 week and then take half tablet by mouth daily for 1 week and then stop. 15 tablet 0  . fluticasone (FLONASE) 50 MCG/ACT nasal spray Place into the nose.    . hydrochlorothiazide (HYDRODIURIL) 12.5 MG tablet Take by mouth.    . hydrochlorothiazide (HYDRODIURIL) 12.5 MG tablet   1  . levothyroxine (SYNTHROID, LEVOTHROID) 112 MCG tablet Take by mouth daily.    . Multiple Vitamin (MULTI-VITAMINS) TABS Take 1 tablet by mouth as needed.      No current facility-administered medications for this visit.    Medical Decision Making:  Established Problem, Stable/Improving (1)  Treatment Plan Summary:Medication management   Major depressive disorder, moderate, recurrent    Continue Abilify at 15 mg daily Continue Wellbutrin XL at 150 mg daily  Autistic spectrum disorder-stable  Patient will follow up in 3 months and to call before if necessary.    Ilene Witcher 08/05/2015, 1:56 PM

## 2015-10-12 ENCOUNTER — Encounter: Payer: Self-pay | Admitting: Psychiatry

## 2015-10-12 ENCOUNTER — Ambulatory Visit (INDEPENDENT_AMBULATORY_CARE_PROVIDER_SITE_OTHER): Payer: Medicare Other | Admitting: Psychiatry

## 2015-10-12 VITALS — BP 128/82 | HR 75 | Temp 98.4°F | Wt 258.6 lb

## 2015-10-12 DIAGNOSIS — F840 Autistic disorder: Secondary | ICD-10-CM | POA: Diagnosis not present

## 2015-10-12 DIAGNOSIS — F331 Major depressive disorder, recurrent, moderate: Secondary | ICD-10-CM | POA: Diagnosis not present

## 2015-10-12 MED ORDER — BUPROPION HCL ER (SR) 200 MG PO TB12: 200.0000 mg | ORAL_TABLET | Freq: Every morning | ORAL | 1 refills | Status: DC

## 2015-10-12 NOTE — Progress Notes (Signed)
Patient ID: Marissa Quinn, female   DOB: May 27, 1955, 61 y.o.   MRN: [id number]  [facility] MD/PA/NP OP Progress Note  10/12/2015 11:52 AM Marissa Quinn  MRN:  [id number]  Subjective:  Patient returns for follow-up for major depressive disorder, moderate, recurrent in autistic spectrum disorder. States she is doing well moodwise. Reports feeling more depressed recently, for about 3 weeks. States good things are happening but she seems to feel down. States her mood dips this time of the year. She is enjoying going to church and listening to [employer]. 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   Chief Complaint: depressed Chief Complaint    Medication Problem     Visit Diagnosis:     ICD-9-CM ICD-10-CM   1. Major depressive disorder, recurrent episode, moderate (HCC) 296.32 F33.1   2. Autism 299.00 F84.0     Past Medical History:  Past Medical History:  Diagnosis Date  . Anxiety   . Asthma   . Depression   . Difficult intubation   . Environmental allergies   . Hypothyroidism   . Sleep apnea   . Thyroid disease     Past Surgical History:  Procedure Laterality Date  . CYSTOSCOPY  05/04/2015   Procedure: CYSTOSCOPY;  Surgeon: Benjaman Kindler, MD;  Location: [facility];  Service: Gynecology;;  . GALLBLADDER SURGERY    . NECK SURGERY    . ROBOTIC ASSISTED LAPAROSCOPIC HYSTERECTOMY AND SALPINGECTOMY Bilateral 03/30/2015   Procedure: ROBOTIC ASSISTED LAPAROSCOPIC HYSTERECTOMY AND SALPINGECTOMY;  Surgeon: Benjaman Kindler, MD;  Location: [facility];  Service: Gynecology;  Laterality: Bilateral;  . ROBOTIC ASSISTED TOTAL HYSTERECTOMY WITH SALPINGECTOMY Bilateral 05/04/2015   Procedure: ROBOTIC ASSISTED TOTAL HYSTERECTOMY WITH BILATERAL SALPINGOOPHERECTOMY;  Surgeon: Benjaman Kindler, MD;  Location: [facility];  Service: Gynecology;  Laterality: Bilateral;  . THYROID SURGERY    . THYROIDECTOMY     total   Family History:  Family History  Problem Relation Age of  Onset  . Brain cancer Mother   . Hypertension Brother   . Depression Paternal Grandmother   . Dementia Father   . Alcohol abuse Father   . Depression Father   . Depression Daughter   . Breast cancer Neg Hx    Social History:  Social History   Social History  . Marital status: Divorced    Spouse name: N/A  . Number of children: N/A  . Years of education: N/A   Social History Main Topics  . Smoking status: Never Smoker  . Smokeless tobacco: Never Used  . Alcohol use No     Comment: Occasionally   . Drug use: No  . Sexual activity: No   Other Topics Concern  . None   Social History Narrative  . None   Additional History:   Assessment:   Musculoskeletal: Strength & Muscle Tone: within normal limits Gait & Station: normal Patient leans: N/A  Psychiatric Specialty Exam: HPI  Review of Systems  Psychiatric/Behavioral: Negative for depression, hallucinations, memory loss, substance abuse and suicidal ideas. The patient is not nervous/anxious and does not have insomnia.   All other systems reviewed and are negative.   Blood pressure 128/82, pulse 75, temperature 98.4 F (36.9 C), temperature source Oral, weight 258 lb 9.6 oz (117.3 kg), last menstrual period 03/24/2011.Body mass index is 44.39 kg/m.  General Appearance: Well Groomed  Eye Contact:  Good  Speech:  Clear and Coherent and Slow  Volume:  Normal  Mood:  depressed  Affect:  Blunt  Thought Process:  Generally appropriate/linear but slightly impoverished  Orientation:  Full (Time, Place, and Person)  Thought Content:  Negative  Suicidal Thoughts:  No  Homicidal Thoughts:  No  Memory:  Immediate;   Good Recent;   Good Remote;   Good  Judgement:  Good  Insight:  Fair  Psychomotor Activity:  Negative  Concentration:  Fair  Recall:  Good  Fund of Knowledge: Fair  Language: Good  Akathisia:  Negative  Handed:  Right unknown  AIMS (if indicated):  Done 07/08/2015 and normal  Assets:  Desire for  Improvement  ADL's:  Intact  Cognition: WNL  Sleep:  Sleeping too much   Is the patient at risk to self?  No. Has the patient been a risk to self in the past 6 months?  No. Has the patient been a risk to self within the distant past?  No. Is the patient a risk to others?  No. Has the patient been a risk to others in the past 6 months?  No. Has the patient been a risk to others within the distant past?  No.  Current Medications: Current Outpatient Prescriptions  Medication Sig Dispense Refill  . albuterol (PROVENTIL HFA;VENTOLIN HFA) 108 (90 BASE) MCG/ACT inhaler Inhale 1 puff into the lungs every 4 (four) hours as needed.     . ARIPiprazole (ABILIFY) 15 MG tablet Take 1 tablet (15 mg total) by mouth daily. 30 tablet 2  . aspirin EC 81 MG tablet Take by mouth.    Marland Kitchen buPROPion (WELLBUTRIN SR) 200 MG 12 hr tablet Take 1 tablet (200 mg total) by mouth every morning. 30 tablet 1  . fluticasone (FLONASE) 50 MCG/ACT nasal spray Place into the nose.    . hydrochlorothiazide (HYDRODIURIL) 12.5 MG tablet Take by mouth.    . hydrochlorothiazide (HYDRODIURIL) 12.5 MG tablet   1  . levothyroxine (SYNTHROID, LEVOTHROID) 112 MCG tablet Take by mouth daily.    . Multiple Vitamin (MULTI-VITAMINS) TABS Take 1 tablet by mouth as needed.      No current facility-administered medications for this visit.     Medical Decision Making:  Established Problem, Stable/Improving (1)  Treatment Plan Summary:Medication management   Major depressive disorder, moderate, recurrent    Continue Abilify at 15 mg daily Increase Wellbutrin to 200mg  daily.  Autistic spectrum disorder-stable  Patient will follow up in 1 months and to call before if necessary.    Cataldo Cosgriff 10/12/2015, 11:52 AM

## 2015-10-13 ENCOUNTER — Ambulatory Visit: Payer: Self-pay | Admitting: Psychiatry

## 2015-11-03 ENCOUNTER — Ambulatory Visit: Payer: Self-pay | Admitting: Psychiatry

## 2015-11-11 ENCOUNTER — Ambulatory Visit: Payer: Medicare Other | Admitting: Licensed Clinical Social Worker

## 2015-11-15 ENCOUNTER — Other Ambulatory Visit: Payer: Self-pay | Admitting: Psychiatry

## 2015-11-19 ENCOUNTER — Ambulatory Visit: Payer: Self-pay | Admitting: Psychiatry

## 2015-12-02 ENCOUNTER — Ambulatory Visit: Payer: Medicare Other | Admitting: Psychiatry

## 2015-12-02 ENCOUNTER — Other Ambulatory Visit: Payer: Self-pay | Admitting: Internal Medicine

## 2015-12-02 DIAGNOSIS — Z1231 Encounter for screening mammogram for malignant neoplasm of breast: Secondary | ICD-10-CM

## 2015-12-07 ENCOUNTER — Ambulatory Visit (INDEPENDENT_AMBULATORY_CARE_PROVIDER_SITE_OTHER): Payer: Medicare Other | Admitting: Psychiatry

## 2015-12-07 ENCOUNTER — Encounter: Payer: Self-pay | Admitting: Psychiatry

## 2015-12-07 VITALS — BP 128/74 | HR 94 | Ht 64.5 in | Wt 253.8 lb

## 2015-12-07 DIAGNOSIS — F331 Major depressive disorder, recurrent, moderate: Secondary | ICD-10-CM

## 2015-12-07 DIAGNOSIS — F840 Autistic disorder: Secondary | ICD-10-CM

## 2015-12-07 MED ORDER — ARIPIPRAZOLE 15 MG PO TABS: 15.0000 mg | ORAL_TABLET | Freq: Every day | ORAL | 2 refills | Status: DC

## 2015-12-07 MED ORDER — BUPROPION HCL ER (SR) 200 MG PO TB12: 200.0000 mg | ORAL_TABLET | Freq: Every morning | ORAL | 2 refills | Status: DC

## 2015-12-07 NOTE — Progress Notes (Signed)
Patient ID: Marissa Quinn, female   DOB: 1955/04/08, 60 y.o.   MRN: [id number]  [facility] MD/PA/NP OP Progress Note  12/07/2015 3:03 PM Marissa Quinn  MRN:  [id number]  Subjective:  Patient returns for follow-up for major depressive disorder, moderate, recurrent in autistic spectrum disorder. Patient reports that she is feeling better on the Bupropion increased dose. States this time of the year is hard for her. She recently visited her daughter and had a good visit. She has changed her therapist.  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   Chief Complaint: improved mood Chief Complaint    Follow-up     Visit Diagnosis:     ICD-9-CM ICD-10-CM   1. Major depressive disorder, recurrent episode, moderate (HCC) 296.32 F33.1   2. Autism 299.00 F84.0     Past Medical History:  Past Medical History:  Diagnosis Date  . Anxiety   . Asthma   . Depression   . Difficult intubation   . Environmental allergies   . Hypothyroidism   . Sleep apnea   . Thyroid disease     Past Surgical History:  Procedure Laterality Date  . CYSTOSCOPY  05/04/2015   Procedure: CYSTOSCOPY;  Surgeon: Benjaman Kindler, MD;  Location: [facility] ORS;  Service: Gynecology;;  . GALLBLADDER SURGERY    . NECK SURGERY    . ROBOTIC ASSISTED LAPAROSCOPIC HYSTERECTOMY AND SALPINGECTOMY Bilateral 03/30/2015   Procedure: ROBOTIC ASSISTED LAPAROSCOPIC HYSTERECTOMY AND SALPINGECTOMY;  Surgeon: Benjaman Kindler, MD;  Location: [facility] OR[facility];  Service: Gynecology;  Laterality: Bilateral;  . ROBOTIC ASSISTED TOTAL HYSTERECTOMY WITH SALPINGECTOMY Bilateral 05/04/2015   Procedure: ROBOTIC ASSISTED TOTAL HYSTERECTOMY WITH BILATERAL SALPINGOOPHERECTOMY;  Surgeon: Benjaman Kindler, MD;  Location: [facility] ORS;  Service: Gynecology;  Laterality: Bilateral;  . THYROID SURGERY    . THYROIDECTOMY     total   Family History:  Family History  Problem Relation Age of Onset  . Brain cancer Mother   . Hypertension Brother    . Depression Paternal Grandmother   . Dementia Father   . Alcohol abuse Father   . Depression Father   . Depression Daughter   . Breast cancer Neg Hx    Social History:  Social History   Social History  . Marital status: Divorced    Spouse name: N/A  . Number of children: N/A  . Years of education: N/A   Social History Main Topics  . Smoking status: Never Smoker  . Smokeless tobacco: Never Used  . Alcohol use No     Comment: Occasionally   . Drug use: No  . Sexual activity: No   Other Topics Concern  . None   Social History Narrative  . None   Additional History:   Assessment:   Musculoskeletal: Strength & Muscle Tone: within normal limits Gait & Station: normal Patient leans: N/A  Psychiatric Specialty Exam: HPI  Review of Systems  Psychiatric/Behavioral: Negative for depression, hallucinations, memory loss, substance abuse and suicidal ideas. The patient is not nervous/anxious and does not have insomnia.   All other systems reviewed and are negative.   Blood pressure 128/74, pulse 94, height 5' 4.5" (1.638 m), weight 253 lb 12.8 oz (115.1 kg), last menstrual period 03/24/2011.Body mass index is 42.89 kg/m.  General Appearance: Well Groomed  Eye Contact:  Good  Speech:  Clear and Coherent and Slow  Volume:  Normal  Mood:  improved  Affect:  Blunt  Thought Process:  Generally appropriate/linear but slightly impoverished  Orientation:  Full (Time, Place, and Person)  Thought Content:  Negative  Suicidal Thoughts:  No  Homicidal Thoughts:  No  Memory:  Immediate;   Good Recent;   Good Remote;   Good  Judgement:  Good  Insight:  Fair  Psychomotor Activity:  Negative  Concentration:  Fair  Recall:  Good  Fund of Knowledge: Fair  Language: Good  Akathisia:  Negative  Handed:  Right   AIMS (if indicated):  Done 07/08/2015 and normal  Assets:  Desire for Improvement  ADL's:  Intact  Cognition: WNL  Sleep:  fair   Is the patient at risk to self?   No. Has the patient been a risk to self in the past 6 months?  No. Has the patient been a risk to self within the distant past?  No. Is the patient a risk to others?  No. Has the patient been a risk to others in the past 6 months?  No. Has the patient been a risk to others within the distant past?  No.  Current Medications: Current Outpatient Prescriptions  Medication Sig Dispense Refill  . albuterol (PROVENTIL HFA;VENTOLIN HFA) 108 (90 BASE) MCG/ACT inhaler Inhale 1 puff into the lungs every 4 (four) hours as needed.     . ARIPiprazole (ABILIFY) 15 MG tablet Take 1 tablet (15 mg total) by mouth daily. 30 tablet 2  . aspirin EC 81 MG tablet Take by mouth.    Marland Kitchen buPROPion (WELLBUTRIN SR) 200 MG 12 hr tablet Take 1 tablet (200 mg total) by mouth every morning. 30 tablet 1  . fluticasone (FLONASE) 50 MCG/ACT nasal spray Place into the nose.    . hydrochlorothiazide (HYDRODIURIL) 12.5 MG tablet Take by mouth.    . hydrochlorothiazide (HYDRODIURIL) 12.5 MG tablet   1  . levothyroxine (SYNTHROID, LEVOTHROID) 112 MCG tablet Take by mouth daily.    . Multiple Vitamin (MULTI-VITAMINS) TABS Take 1 tablet by mouth as needed.      No current facility-administered medications for this visit.     Medical Decision Making:  Established Problem, Stable/Improving (1)  Treatment Plan Summary:Medication management   Major depressive disorder, moderate, recurrent    Continue Abilify at 15 mg daily continue Wellbutrin to 200mg  daily. Continue to see her therapist.  Autistic spectrum disorder-stable  Patient will follow up in 3 months and to call before if necessary.    Jermal Dismuke 12/07/2015, 3:03 PM

## 2015-12-15 ENCOUNTER — Telehealth: Payer: Self-pay

## 2015-12-15 NOTE — Telephone Encounter (Signed)
Medication management - Patient left a message requesting Dr. Einar Grad to begin decreasing her Abilify.

## 2015-12-15 NOTE — Telephone Encounter (Signed)
She can start to take half the dosage of Abilify and see how she does in a month's time.

## 2015-12-15 NOTE — Telephone Encounter (Signed)
Medication management - Telephone call with patient to inform Dr. Einar Grad approved her cutting back on Abilify to 1/2 tablet a day of her 15 mg tablet.  Patient agreed to call back  if any problems with decrease over the coming month.

## 2016-01-06 ENCOUNTER — Ambulatory Visit: Payer: Medicare Other

## 2016-01-11 ENCOUNTER — Telehealth: Payer: Self-pay

## 2016-01-11 NOTE — Telephone Encounter (Signed)
Please let patient know that if she is doing well without any problems from cutting the dose of the Abilify to half she can stop taking the Abilify and see how she does.

## 2016-01-11 NOTE — Telephone Encounter (Signed)
was ok to cut pill in half.  pt wants to know if she needs to continue with just the half tablet or stop taking all together.

## 2016-01-11 NOTE — Telephone Encounter (Signed)
spoke with patient gave instructions that per dr. Einar Grad patient as long as she is not having any issues she can try to stop medication. spoke with patient gave instructions that per dr. Einar Grad patient as long as she is not having any issues she can try to stop medication.

## 2016-02-09 ENCOUNTER — Ambulatory Visit
Admission: RE | Admit: 2016-02-09 | Discharge: 2016-02-09 | Disposition: A | Discharge status: Home / Self Care | Payer: Medicare Other | Source: Ambulatory Visit | Attending: Internal Medicine | Admitting: Internal Medicine

## 2016-02-09 DIAGNOSIS — Z1231 Encounter for screening mammogram for malignant neoplasm of breast: Secondary | ICD-10-CM | POA: Diagnosis present

## 2016-02-12 ENCOUNTER — Other Ambulatory Visit: Payer: Self-pay | Admitting: Psychiatry

## 2016-02-13 ENCOUNTER — Other Ambulatory Visit: Payer: Self-pay | Admitting: Psychiatry

## 2016-02-13 DIAGNOSIS — F331 Major depressive disorder, recurrent, moderate: Secondary | ICD-10-CM

## 2016-02-15 NOTE — Telephone Encounter (Signed)
left message on doctor's line to refill both medications with one additional refill left on both.

## 2016-02-15 NOTE — Telephone Encounter (Signed)
received a fax requesting a refill on patient abilify and wellburtrin.

## 2016-03-08 ENCOUNTER — Encounter: Payer: Self-pay | Admitting: Psychiatry

## 2016-03-08 ENCOUNTER — Ambulatory Visit (INDEPENDENT_AMBULATORY_CARE_PROVIDER_SITE_OTHER): Payer: Medicare Other | Admitting: Psychiatry

## 2016-03-08 VITALS — BP 131/81 | HR 80 | Temp 98.7°F | Wt 229.0 lb

## 2016-03-08 DIAGNOSIS — F331 Major depressive disorder, recurrent, moderate: Secondary | ICD-10-CM

## 2016-03-08 DIAGNOSIS — F4310 Post-traumatic stress disorder, unspecified: Secondary | ICD-10-CM

## 2016-03-08 DIAGNOSIS — F840 Autistic disorder: Secondary | ICD-10-CM

## 2016-03-08 MED ORDER — BUPROPION HCL ER (SR) 200 MG PO TB12: 200.0000 mg | ORAL_TABLET | Freq: Every morning | ORAL | 2 refills | Status: DC

## 2016-03-08 NOTE — Progress Notes (Signed)
Patient ID: Marissa Quinn, female   DOB: 1955/07/26, 61 y.o.   MRN: [id number]  [facility] MD/PA/NP OP Progress Note  03/08/2016 3:05 PM BREES LECLERC  MRN:  [id number]  Subjective:  Patient returns for follow-up for major depressive disorder, moderate, recurrent in autistic spectrum disorder. Reports doing well on the Bupropion. She has been able to taper off the abilify and doing well. States she feels much better and more awake. She has been exercising well and eating healthy. 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   Chief Complaint: Doing well.  Chief Complaint    Follow-up; Medication Refill     Visit Diagnosis:     ICD-9-CM ICD-10-CM   1. Major depressive disorder, recurrent episode, moderate (HCC) 296.32 F33.1   2. Autism 299.00 F84.0   3. PTSD (post-traumatic stress disorder) 309.81 F43.10     Past Medical History:  Past Medical History:  Diagnosis Date  . Anxiety   . Asthma   . Depression   . Difficult intubation   . Environmental allergies   . Hypothyroidism   . Sleep apnea   . Thyroid disease     Past Surgical History:  Procedure Laterality Date  . CYSTOSCOPY  05/04/2015   Procedure: CYSTOSCOPY;  Surgeon: Benjaman Kindler, MD;  Location: [facility];  Service: Gynecology;;  . GALLBLADDER SURGERY    . NECK SURGERY    . ROBOTIC ASSISTED LAPAROSCOPIC HYSTERECTOMY AND SALPINGECTOMY Bilateral 03/30/2015   Procedure: ROBOTIC ASSISTED LAPAROSCOPIC HYSTERECTOMY AND SALPINGECTOMY;  Surgeon: Benjaman Kindler, MD;  Location: [facility] OR[facility];  Service: Gynecology;  Laterality: Bilateral;  . ROBOTIC ASSISTED TOTAL HYSTERECTOMY WITH SALPINGECTOMY Bilateral 05/04/2015   Procedure: ROBOTIC ASSISTED TOTAL HYSTERECTOMY WITH BILATERAL SALPINGOOPHERECTOMY;  Surgeon: Benjaman Kindler, MD;  Location: [facility];  Service: Gynecology;  Laterality: Bilateral;  . THYROID SURGERY    . THYROIDECTOMY     total   Family History:  Family History  Problem Relation Age of  Onset  . Brain cancer Mother   . Hypertension Brother   . Depression Paternal Grandmother   . Dementia Father   . Alcohol abuse Father   . Depression Father   . Depression Daughter   . Breast cancer Neg Hx    Social History:  Social History   Social History  . Marital status: Divorced    Spouse name: N/A  . Number of children: N/A  . Years of education: N/A   Social History Main Topics  . Smoking status: Never Smoker  . Smokeless tobacco: Never Used  . Alcohol use No     Comment: Occasionally   . Drug use: No  . Sexual activity: No   Other Topics Concern  . None   Social History Narrative  . None   Additional History:   Assessment:   Musculoskeletal: Strength & Muscle Tone: within normal limits Gait & Station: normal Patient leans: N/A  Psychiatric Specialty Exam: HPI  Review of Systems  Psychiatric/Behavioral: Negative for depression, hallucinations, memory loss, substance abuse and suicidal ideas. The patient is not nervous/anxious and does not have insomnia.   All other systems reviewed and are negative.   Blood pressure 131/81, pulse 80, temperature 98.7 F (37.1 C), temperature source Oral, weight 229 lb (103.9 kg), last menstrual period 03/24/2011.Body mass index is 38.7 kg/m.  General Appearance: Well Groomed  Eye Contact:  Good  Speech:  Clear and Coherent and Slow  Volume:  Normal  Mood:  improved  Affect:  smiling  Thought Process:  normal  Orientation:  Full (Time, Place, and Person)  Thought Content:  Negative  Suicidal Thoughts:  No  Homicidal Thoughts:  No  Memory:  Immediate;   Good Recent;   Good Remote;   Good  Judgement:  Good  Insight:  Fair  Psychomotor Activity:  Negative  Concentration:  Fair  Recall:  Good  Fund of Knowledge: Fair  Language: Good  Akathisia:  Negative  Handed:  Right   AIMS (if indicated):  Done 07/08/2015 and normal  Assets:  Desire for Improvement  ADL's:  Intact  Cognition: WNL  Sleep:  fair    Is the patient at risk to self?  No. Has the patient been a risk to self in the past 6 months?  No. Has the patient been a risk to self within the distant past?  No. Is the patient a risk to others?  No. Has the patient been a risk to others in the past 6 months?  No. Has the patient been a risk to others within the distant past?  No.  Current Medications: Current Outpatient Prescriptions  Medication Sig Dispense Refill  . albuterol (PROVENTIL HFA;VENTOLIN HFA) 108 (90 BASE) MCG/ACT inhaler Inhale 1 puff into the lungs every 4 (four) hours as needed.     . ARIPiprazole (ABILIFY) 15 MG tablet Take 1 tablet (15 mg total) by mouth daily. 30 tablet 2  . aspirin EC 81 MG tablet Take by mouth.    Marland Kitchen buPROPion (WELLBUTRIN SR) 200 MG 12 hr tablet take 1 tablet by mouth every morning 30 tablet 2  . fluticasone (FLONASE) 50 MCG/ACT nasal spray Place into the nose.    . hydrochlorothiazide (HYDRODIURIL) 12.5 MG tablet   1  . levothyroxine (SYNTHROID, LEVOTHROID) 112 MCG tablet Take by mouth daily.    . Multiple Vitamin (MULTI-VITAMINS) TABS Take 1 tablet by mouth as needed.     . hydrochlorothiazide (HYDRODIURIL) 12.5 MG tablet Take by mouth.     No current facility-administered medications for this visit.     Medical Decision Making:  Established Problem, Stable/Improving (1)  Treatment Plan Summary:Medication management   Major depressive disorder, moderate, recurrent    continue Wellbutrin to 200mg  daily. Continue to see her therapist.  Autistic spectrum disorder-stable  Patient will follow up in 3 months and to call before if necessary.    Morio Widen 03/08/2016, 3:05 PM

## 2016-03-15 ENCOUNTER — Telehealth: Payer: Self-pay

## 2016-03-15 NOTE — Telephone Encounter (Signed)
pt called stated that you wanted some labwork on her she thought it was a lipid panel but not sure but you did not give her an order to have it done.

## 2016-03-29 ENCOUNTER — Telehealth: Payer: Self-pay

## 2016-03-29 ENCOUNTER — Emergency Department
Admission: EM | Admit: 2016-03-29 | Discharge: 2016-03-30 | Payer: Medicare Other | Attending: Emergency Medicine | Admitting: Emergency Medicine

## 2016-03-29 ENCOUNTER — Encounter: Payer: Self-pay | Admitting: Emergency Medicine

## 2016-03-29 ENCOUNTER — Emergency Department: Payer: Medicare Other

## 2016-03-29 DIAGNOSIS — R079 Chest pain, unspecified: Secondary | ICD-10-CM | POA: Insufficient documentation

## 2016-03-29 DIAGNOSIS — J45909 Unspecified asthma, uncomplicated: Secondary | ICD-10-CM | POA: Insufficient documentation

## 2016-03-29 DIAGNOSIS — F29 Unspecified psychosis not due to a substance or known physiological condition: Secondary | ICD-10-CM | POA: Diagnosis not present

## 2016-03-29 DIAGNOSIS — F312 Bipolar disorder, current episode manic severe with psychotic features: Secondary | ICD-10-CM | POA: Diagnosis not present

## 2016-03-29 DIAGNOSIS — I10 Essential (primary) hypertension: Secondary | ICD-10-CM | POA: Diagnosis present

## 2016-03-29 DIAGNOSIS — Z7982 Long term (current) use of aspirin: Secondary | ICD-10-CM | POA: Diagnosis not present

## 2016-03-29 DIAGNOSIS — E039 Hypothyroidism, unspecified: Secondary | ICD-10-CM | POA: Diagnosis not present

## 2016-03-29 DIAGNOSIS — Z79899 Other long term (current) drug therapy: Secondary | ICD-10-CM | POA: Insufficient documentation

## 2016-03-29 DIAGNOSIS — F23 Brief psychotic disorder: Secondary | ICD-10-CM

## 2016-03-29 LAB — CBC
HEMATOCRIT: 41 % (ref 35.0–47.0)
HEMOGLOBIN: 14.3 g/dL (ref 12.0–16.0)
MCH: 31.3 pg (ref 26.0–34.0)
MCHC: 34.9 g/dL (ref 32.0–36.0)
MCV: 89.8 fL (ref 80.0–100.0)
Platelets: 272 10*3/uL (ref 150–440)
RBC: 4.57 MIL/uL (ref 3.80–5.20)
RDW: 13.9 % (ref 11.5–14.5)
WBC: 6.6 10*3/uL (ref 3.6–11.0)

## 2016-03-29 LAB — COMPREHENSIVE METABOLIC PANEL
ALK PHOS: 62 U/L (ref 38–126)
ALT: 19 U/L (ref 14–54)
AST: 31 U/L (ref 15–41)
Albumin: 4.7 g/dL (ref 3.5–5.0)
Anion gap: 11 (ref 5–15)
BILIRUBIN TOTAL: 0.8 mg/dL (ref 0.3–1.2)
BUN: 10 mg/dL (ref 6–20)
CALCIUM: 9.9 mg/dL (ref 8.9–10.3)
CO2: 26 mmol/L (ref 22–32)
CREATININE: 0.94 mg/dL (ref 0.44–1.00)
Chloride: 105 mmol/L (ref 101–111)
GFR calc Af Amer: >60 mL/min (ref >60)
Glucose, Bld: 104 mg/dL — ABNORMAL HIGH (ref 65–99)
Potassium: 3.1 mmol/L — ABNORMAL LOW (ref 3.5–5.1)
Sodium: 142 mmol/L (ref 135–145)
TOTAL PROTEIN: 8.7 g/dL — AB (ref 6.5–8.1)

## 2016-03-29 LAB — LIPID PANEL
CHOLESTEROL: 203 mg/dL — AB (ref 0–200)
HDL: 74 mg/dL (ref >40)
LDL Cholesterol: 117 mg/dL — ABNORMAL HIGH (ref 0–99)
Total CHOL/HDL Ratio: 2.7 RATIO
Triglycerides: 60 mg/dL (ref <150)
VLDL: 12 mg/dL (ref 0–40)

## 2016-03-29 LAB — TROPONIN I

## 2016-03-29 LAB — TSH: TSH: 9.761 u[IU]/mL — ABNORMAL HIGH (ref 0.350–4.500)

## 2016-03-29 LAB — T4, FREE: FREE T4: 0.95 ng/dL (ref 0.61–1.12)

## 2016-03-29 MED ORDER — ARIPIPRAZOLE 10 MG PO TABS
20.0000 mg | ORAL_TABLET | Freq: Every day | ORAL | Status: DC
Start: 2016-03-29 — End: 2016-03-30
  Administered 2016-03-30: 20 mg via ORAL
  Filled 2016-03-29: qty 2

## 2016-03-29 MED ORDER — HYDROCHLOROTHIAZIDE 12.5 MG PO CAPS
12.5000 mg | ORAL_CAPSULE | Freq: Every day | ORAL | Status: DC
Start: 2016-03-29 — End: 2016-03-30
  Administered 2016-03-30: 12.5 mg via ORAL
  Filled 2016-03-29: qty 1

## 2016-03-29 MED ORDER — ZIPRASIDONE MESYLATE 20 MG IM SOLR
INTRAMUSCULAR | Status: AC
  Administered 2016-03-29: 20 mg via INTRAMUSCULAR
  Filled 2016-03-29: qty 20

## 2016-03-29 MED ORDER — HALOPERIDOL LACTATE 5 MG/ML IJ SOLN
10.0000 mg | Freq: Once | INTRAMUSCULAR | Status: AC
  Administered 2016-03-29: 10 mg via INTRAMUSCULAR
  Filled 2016-03-29: qty 2

## 2016-03-29 MED ORDER — DIAZEPAM 5 MG PO TABS
10.0000 mg | ORAL_TABLET | Freq: Once | ORAL | Status: DC
  Filled 2016-03-29: qty 2

## 2016-03-29 MED ORDER — BENZTROPINE MESYLATE 0.5 MG PO TABS
0.5000 mg | ORAL_TABLET | Freq: Two times a day (BID) | ORAL | Status: DC | PRN
  Administered 2016-03-30: 0.5 mg via ORAL
  Filled 2016-03-29: qty 1

## 2016-03-29 MED ORDER — ZIPRASIDONE MESYLATE 20 MG IM SOLR
20.0000 mg | Freq: Once | INTRAMUSCULAR | Status: AC
  Administered 2016-03-29: 20 mg via INTRAMUSCULAR

## 2016-03-29 MED ORDER — LEVOTHYROXINE SODIUM 50 MCG PO TABS
125.0000 ug | ORAL_TABLET | Freq: Every day | ORAL | Status: DC
  Administered 2016-03-30: 125 ug via ORAL
  Filled 2016-03-29 (×2): qty 1

## 2016-03-29 NOTE — ED Provider Notes (Signed)
Patient becoming increasingly agitated and disruptive.  Refusing to change out into clothes and allow staff to remove Iv.  Becoming aggressive.  For the patient's safety and that of other providers and patients, will provide a calming agent of IM haldol.  Will continue to monitor.     Merlyn Lot, MD 03/29/16 1725

## 2016-03-29 NOTE — ED Notes (Signed)
Pt refusing to get changed out. Pt talking in circles, asking the same questions over and over again.

## 2016-03-29 NOTE — Telephone Encounter (Signed)
I just called patient's daughter and she reports that patient had been doing well until Friday when she started having crying episodes starting Saturday and was feeling shaky. Patient's daughter also reports that patient had not been eating and had gotten worse over the past 2 days Patient reports she last saw her in January. States does not like her mom. It was discussed with patient's daughter that at her last visit on February 13 patient had been doing extremely well and this does seem unusual and she will need a thorough psychiatric assessment and medical assessment. Recommended to daughter that she follow up with the recommendations made by  ER physician and the psychiatric consultation.

## 2016-03-29 NOTE — ED Notes (Signed)
Pt provided graham crackers, peanut butter, apple sauce, and ginger ale.

## 2016-03-29 NOTE — BH Assessment (Signed)
Per Dr. Kathreen Cosier patient meets criteria for inpatient hospitalization.  Writer informed the Camera operator Marcie Bal).  Marcie Bal will contact me with bed a bed assignment. Writer informed the nurse Anda Kraft) of the patients disposition.

## 2016-03-29 NOTE — Telephone Encounter (Signed)
her daughter called states that she just got off the phone with [facility] dr. Jimmye Norman.  and he wanted to speak with dr. Einar Grad because they could not get ahold of Dr on call for phsyicatric eval in [place].  they wanted to find out about pt hx.

## 2016-03-29 NOTE — Consult Note (Signed)
[facility] Consult   Reason for Consult:  Consult for 61 year old woman brought to the emergency room by some friends out of concern for recent dramatic mental status change Referring Physician:  Jimmye Norman Patient Identification: Marissa Quinn MRN:  [id number] Principal Diagnosis: Bipolar affective disorder, manic, severe, with psychotic behavior ([place]) Diagnosis:   Patient Active Problem List   Diagnosis Date Noted  . Bipolar affective disorder, manic, severe, with psychotic behavior ([place]) [F31.2] 03/29/2016  . Postmenopausal bleeding [N95.0] 05/04/2015  . Thickened endometrium [R93.8] 04/09/2015  . Body mass index (BMI) of 45.0-49.9 in adult [facility]) [Z68.42] 03/04/2015  . Major depression, single episode, in complete remission ([place]) [F32.5] 03/04/2015  . Disorder of patellofemoral joint [M22.2X9] 03/04/2015  . Hemorrhage, postmenopausal [N95.0] 01/09/2015  . Autism spectrum [F84.0] 06/30/2014  . Clinical depression [F32.9] 06/30/2014  . Anxiety, generalized [F41.1] 06/30/2014  . Insomnia, persistent [G47.00] 06/30/2014  . Depression, major, recurrent, moderate ([place]) [F33.1] 06/30/2014  . Neurosis, posttraumatic [F43.10] 06/30/2014  . Depression, major, recurrent, in complete remission ([place]) [F33.42] 06/30/2014  . Attempted suicide [T14.91XA] 06/30/2014  . Obstructive apnea [G47.33] 06/05/2014  . H/O: HTN (hypertension) [Z86.79] 05/16/2014  . H/O: hypothyroidism [Z86.39] 05/16/2014  . H/O: obesity [Z86.39] 05/16/2014  . Apnea, sleep [G47.30] 05/16/2014  . History of neurodevelopmental disorder [Z86.69] 05/16/2014  . 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 Essential (primary) hypertension [I10] 02/26/2014  . Leg numbness [R20.0] 11/21/2013  . Extreme obesity ([place]) [E66.01] 11/21/2013  . Mechanical and motor problems with internal organs [R68.89] 11/21/2013  . Other general symptoms and signs [R68.89] 11/21/2013  . Morbid obesity ([place]) [E66.01] 11/21/2013   . Spells [IMO0001] 11/21/2013  . Adult hypothyroidism [E03.9] 07/18/2013  . Anxiety and depression [F41.8] 05/22/2012    Total Time spent with patient: 1 hour  Subjective:   Marissa Quinn is a 61 y.o. female patient admitted with "somebody spoke to me".  HPI:  Patient interviewed. Patient was very difficult to interview and was not able to provide much in the way of useful information other than displaying her abnormal mental state. I got some information from her coworkers who are the people who brought her in and from the chart. 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 This has only worsened over the last day or so. They say that her current mental status is nothing at all like her usual presentation. Patient presents as hyperverbal and very disorganized in her speech. It is almost impossible to have an interview with her because she answers every question by asking several questions back at me and then changing the subject. Patient is tangential and shows evidence of being hyper religious demanding that everyone pray with her continuously. Also slightly flirtatious. Reviewing her outpatient records she has been tapering off of Abilify recently. Over the last month it appears she has stopped taking Abilify altogether. Denies any drug use.  Social history: Patient lives by herself in an apartment. 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 She does have an adult daughter who has been notified that the patient is sick and may be on her way up.. Patient's coworkers appear to be very concerned about her well-being.  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 Mild to  moderate high blood pressure. Overweight.  Substance abuse history: Nothing in the old chart to suggest any past history of substance abuse nothing in the current presentation to suggest acute substance intoxication.  Past Psychiatric History: Patient has a past history of a chronic diagnosis of autism spectrum disorder but also carries a diagnosis of major depression. Going back a few years it's clear that she has had severe depressive episodes in the past and does have a past history of suicide attempts although she was not able to give me any details about it. I could not find evidence in the chart that she has ever had a presentation like what she does now which appears to be pretty typically manic. And on antidepressant most recently Wellbutrin and Abilify for years. Did have a recent change of medicine.  Risk to Self: Is patient at risk for suicide?: No Risk to Others:   Prior Inpatient Therapy:   Prior Outpatient Therapy:    Past Medical History:  Past Medical History:  Diagnosis Date  . Anxiety   . Asthma   . Depression   . Difficult intubation   . Environmental allergies   . Hypothyroidism   . Sleep apnea   . Thyroid disease     Past Surgical History:  Procedure Laterality Date  . CYSTOSCOPY  05/04/2015   Procedure: CYSTOSCOPY;  Surgeon: Christeen Douglas, MD;  Location: [facility] ORS;  Service: Gynecology;;  . GALLBLADDER SURGERY    . NECK SURGERY    . ROBOTIC ASSISTED LAPAROSCOPIC HYSTERECTOMY AND SALPINGECTOMY Bilateral 03/30/2015   Procedure: ROBOTIC ASSISTED LAPAROSCOPIC HYSTERECTOMY AND SALPINGECTOMY;  Surgeon: Christeen Douglas, MD;  Location: [facility] ORS;  Service: Gynecology;  Laterality: Bilateral;  . ROBOTIC ASSISTED TOTAL HYSTERECTOMY WITH SALPINGECTOMY Bilateral 05/04/2015   Procedure: ROBOTIC ASSISTED TOTAL HYSTERECTOMY WITH BILATERAL SALPINGOOPHERECTOMY;  Surgeon: Christeen Douglas, MD;  Location: [facility] ORS;  Service: Gynecology;  Laterality: Bilateral;  . THYROID SURGERY    .  THYROIDECTOMY     total   Family History:  Family History  Problem Relation Age of Onset  . Brain cancer Mother   . Hypertension Brother   . Depression Paternal Grandmother   . Dementia Father   . Alcohol abuse Father   . Depression Father   . Depression Daughter   . Breast cancer Neg Hx    Family Psychiatric  History: Unknown Social History:  History  Alcohol Use No    Comment: Occasionally      History  Drug Use No    Social History   Social History  . Marital status: Divorced    Spouse name: N/A  . Number of children: N/A  . Years of education: N/A   Social History Main Topics  . Smoking status: Never Smoker  . Smokeless tobacco: Never Used  . Alcohol use No     Comment: Occasionally   . Drug use: No  . Sexual activity: No   Other Topics Concern  . None   Social History Narrative  . None   Additional Social History:    Allergies:   Allergies  Allergen Reactions  . Risperidone Anaphylaxis  . Lansoprazole     Other reaction(s): Unknown  . Nitrofurantoin Monohyd Macro Nausea Only  . Risperidone And Related   . Tetracycline   . Tetracyclines & Related     Other reaction(s): Other (See Comments) Allergy per patient--reaction unknown    Labs:  Results for orders  placed or performed during the hospital encounter of 03/29/16 (from the past 48 hour(s))  CBC     Status: None   Collection Time: 03/29/16  8:41 AM  Result Value Ref Range   WBC 6.6 3.6 - 11.0 K/uL   RBC 4.57 3.80 - 5.20 MIL/uL   Hemoglobin 14.3 12.0 - 16.0 g/dL   HCT 41.0 35.0 - 47.0 %   MCV 89.8 80.0 - 100.0 fL   MCH 31.3 26.0 - 34.0 pg   MCHC 34.9 32.0 - 36.0 g/dL   RDW 13.9 11.5 - 14.5 %   Platelets 272 150 - 440 K/uL  Troponin I     Status: None   Collection Time: 03/29/16  8:41 AM  Result Value Ref Range   Troponin I <0.03 <0.03 ng/mL  Comprehensive metabolic panel     Status: Abnormal   Collection Time: 03/29/16  8:41 AM  Result Value Ref Range   Sodium 142 135 - 145  mmol/L   Potassium 3.1 (L) 3.5 - 5.1 mmol/L   Chloride 105 101 - 111 mmol/L   CO2 26 22 - 32 mmol/L   Glucose, Bld 104 (H) 65 - 99 mg/dL   BUN 10 6 - 20 mg/dL   Creatinine, Ser 0.94 0.44 - 1.00 mg/dL   Calcium 9.9 8.9 - 10.3 mg/dL   Total Protein 8.7 (H) 6.5 - 8.1 g/dL   Albumin 4.7 3.5 - 5.0 g/dL   AST 31 15 - 41 U/L   ALT 19 14 - 54 U/L   Alkaline Phosphatase 62 38 - 126 U/L   Total Bilirubin 0.8 0.3 - 1.2 mg/dL   GFR calc non Af Amer >60 >60 mL/min   GFR calc Af Amer >60 >60 mL/min    Comment: (NOTE) The eGFR has been calculated using the CKD EPI equation. This calculation has not been validated in all clinical situations. eGFR's persistently <60 mL/min signify possible Chronic Kidney Disease.    Anion gap 11 5 - 15  Lipid panel     Status: Abnormal   Collection Time: 03/29/16  8:41 AM  Result Value Ref Range   Cholesterol 203 (H) 0 - 200 mg/dL   Triglycerides 60 <150 mg/dL   HDL 74 >40 mg/dL   Total CHOL/HDL Ratio 2.7 RATIO   VLDL 12 0 - 40 mg/dL   LDL Cholesterol 117 (H) 0 - 99 mg/dL    Comment:        Total Cholesterol/HDL:CHD Risk Coronary Heart Disease Risk Table                     Men   Women  1/2 Average Risk   3.4   3.3  Average Risk       5.0   4.4  2 X Average Risk   9.6   7.1  3 X Average Risk  23.4   11.0        Use the calculated Patient Ratio above and the CHD Risk Table to determine the patient's CHD Risk.        ATP III CLASSIFICATION (LDL):  <100     mg/dL   Optimal  100-129  mg/dL   Near or Above                    Optimal  130-159  mg/dL   Borderline  160-189  mg/dL   High  >190     mg/dL   Very High   TSH  Status: Abnormal   Collection Time: 03/29/16  8:41 AM  Result Value Ref Range   TSH 9.761 (H) 0.350 - 4.500 uIU/mL    Comment: Performed by a 3rd Generation assay with a functional sensitivity of <=0.01 uIU/mL.  T4, free     Status: None   Collection Time: 03/29/16  8:41 AM  Result Value Ref Range   Free T4 0.95 0.61 - 1.12  ng/dL    Comment: (NOTE) Biotin ingestion may interfere with free T4 tests. If the results are inconsistent with the TSH level, previous test results, or the clinical presentation, then consider biotin interference. If needed, order repeat testing after stopping biotin.     Current Facility-Administered Medications  Medication Dose Route Frequency Provider Last Rate Last Dose  . ARIPiprazole (ABILIFY) tablet 20 mg  20 mg Oral Daily Gonzella Lex, MD      . benztropine (COGENTIN) tablet 0.5 mg  0.5 mg Oral BID PRN Gonzella Lex, MD      . diazepam (VALIUM) tablet 10 mg  10 mg Oral Once Earleen Newport, MD      . hydrochlorothiazide (MICROZIDE) capsule 12.5 mg  12.5 mg Oral Daily Gonzella Lex, MD      . Derrill Memo ON 03/30/2016] levothyroxine (SYNTHROID, LEVOTHROID) tablet 125 mcg  125 mcg Oral QAC breakfast Gonzella Lex, MD       Current Outpatient Prescriptions  Medication Sig Dispense Refill  . albuterol (PROVENTIL HFA;VENTOLIN HFA) 108 (90 BASE) MCG/ACT inhaler Inhale 1 puff into the lungs every 4 (four) hours as needed.     Marland Kitchen aspirin EC 81 MG tablet Take by mouth.    Marland Kitchen buPROPion (WELLBUTRIN SR) 200 MG 12 hr tablet Take 1 tablet (200 mg total) by mouth every morning. 30 tablet 2  . fluticasone (FLONASE) 50 MCG/ACT nasal spray Place into the nose.    . hydrochlorothiazide (HYDRODIURIL) 12.5 MG tablet Take 12.5 mg by mouth daily.   1  . levocetirizine (XYZAL) 5 MG tablet Take 5 mg by mouth at bedtime.    Marland Kitchen levothyroxine (SYNTHROID, LEVOTHROID) 125 MCG tablet Take 125 mcg by mouth daily.     Marland Kitchen terbinafine (LAMISIL) 250 MG tablet Take 250 mg by mouth daily.    . Multiple Vitamin (MULTI-VITAMINS) TABS Take 1 tablet by mouth as needed.       Musculoskeletal: Strength & Muscle Tone: within normal limits Gait & Station: normal Patient leans: N/A  Psychiatric Specialty Exam: Physical Exam  Nursing note and vitals reviewed. Constitutional: She appears well-developed and  well-nourished.  HENT:  Head: Normocephalic and atraumatic.  Eyes: Conjunctivae are normal. Pupils are equal, round, and reactive to light.  Neck: Normal range of motion.  Cardiovascular: Regular rhythm and normal heart sounds.   Respiratory: Effort normal. No respiratory distress.  GI: Soft.  Musculoskeletal: Normal range of motion.  Neurological: She is alert.  Skin: Skin is warm and dry.  Psychiatric: Her affect is labile and inappropriate. Her speech is tangential. She is hyperactive. She is not aggressive. Thought content is paranoid. Cognition and memory are impaired. She expresses impulsivity. She expresses no homicidal and no suicidal ideation. She is inattentive.    Review of Systems  Unable to perform ROS: Psychiatric disorder    Blood pressure (!) 153/108, pulse 93, temperature 98.8 F (37.1 C), temperature source Oral, resp. rate 18, height '5\' 5"'$  (1.651 m), weight 103.9 kg (229 lb), last menstrual period 03/24/2011, SpO2 99 %.Body mass index is 38.11 kg/m.  General Appearance: Casual  Eye Contact:  Good  Speech:  Garbled and Pressured  Volume:  Increased  Mood:  Euphoric  Affect:  Inappropriate and Labile  Thought Process:  Disorganized and Irrelevant  Orientation:  NA  Thought Content:  Illogical, Rumination and Tangential  Suicidal Thoughts:  No  Homicidal Thoughts:  No  Memory:  Negative  Judgement:  Impaired  Insight:  Shallow  Psychomotor Activity:  Restlessness and TD  Concentration:  Concentration: Poor  Recall:  Poor  Fund of Knowledge:  Fair  Language:  Fair  Akathisia:  No  Handed:  Right  AIMS (if indicated):     Assets:  Desire for Improvement Financial Resources/Insurance Housing Resilience Social Support  ADL's:  Impaired  Cognition:  Impaired,  Mild  Sleep:        Treatment Plan Summary: Daily contact with patient to assess and evaluate symptoms and progress in treatment, Medication management and Plan This is a 61 year old woman who  presents with about a four-day history of emergence of manic symptoms. Reports indicate from the patient and her friends that she appears not to have slept in several days. Her speech is hyperactive and disorganized. Her affect is euphoric inappropriate odd and labile. Her thoughts are very disorganized. Does not show delirium however. Not threatening but does not appear at all to be capable of taking care of herself. Patient was placed under involuntary commitment and will be admitted to the psychiatric unit. Admission justified by the symptom complex typical of bipolar disorder separate from any autistic diagnosis. Restart Abilify. Discontinue antidepressant. Continue her thyroid hormone. I noticed that her TSH is a little bit elevated. Possibly noncompliant. Needs to be rechecked. Patient also it should be noted has a pill-rolling tremor in both of her hands. She is able to stop it. I didn't see anything that looked obviously like tardive dyskinesia in her face. Despite this she clearly needs to be back on antipsychotic. Labs will be checked. 15 minute checks.  Disposition: Recommend psychiatric Inpatient admission when medically cleared. Supportive therapy provided about ongoing stressors.  Alethia Berthold, MD 03/29/2016 3:27 PM

## 2016-03-29 NOTE — ED Triage Notes (Signed)
Pt to ed via acems with reports of chest pain for two weeks. Thyroid meds increased two weeks ago then cp started. Pt tearful on arrival to ed. Ems reports all vss. edp at bedside.

## 2016-03-29 NOTE — ED Notes (Signed)
PT IVC/ PENDING PLACEMENT  

## 2016-03-29 NOTE — ED Notes (Signed)
Friends at bedside. Pt with no acute distress noted at this time.

## 2016-03-29 NOTE — ED Notes (Signed)
Co workers at bedside and report "odd" behavior starting Friday similar to manic behavior.

## 2016-03-29 NOTE — ED Provider Notes (Signed)
[facility] Emergency Department Provider Note        Time seen: ----------------------------------------- 8:20 AM on 03/29/2016 -----------------------------------------    I have reviewed the triage vital signs and the nursing notes.   HISTORY  Chief Complaint Chest Pain    HPI Marissa Quinn is a 61 y.o. female who presents to ER for chest pain. Patient rated the pain as 2 out of 10, nothing made it better or worse. She's also had some diarrhea but otherwise denies complaints. She denies recent illness. She does report a history of mental illness and schizophrenia. Patient thinks she's been misdiagnosed. She arrived to the ER crying. Patient is asking me to check her cholesterol because her psychiatrist told her that it was important.   Past Medical History:  Diagnosis Date  . Anxiety   . Asthma   . Depression   . Difficult intubation   . Environmental allergies   . Hypothyroidism   . Sleep apnea   . Thyroid disease     Patient Active Problem List   Diagnosis Date Noted  . Postmenopausal bleeding 05/04/2015  . Thickened endometrium 04/09/2015  . Body mass index (BMI) of 45.0-49.9 in adult ([place]) 03/04/2015  . Major depression, single episode, in complete remission ([place]) 03/04/2015  . Disorder of patellofemoral joint 03/04/2015  . Hemorrhage, postmenopausal 01/09/2015  . Autism spectrum 06/30/2014  . Clinical depression 06/30/2014  . Anxiety, generalized 06/30/2014  . Insomnia, persistent 06/30/2014  . Depression, major, recurrent, moderate ([place]) 06/30/2014  . Neurosis, 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/30/2014  . Depression, major, recurrent, in complete remission ([place]) 06/30/2014  . Attempted suicide 06/30/2014  . Obstructive apnea 06/05/2014  . H/O: HTN (hypertension) 05/16/2014  . H/O: hypothyroidism 05/16/2014  . H/O: obesity 05/16/2014  . Apnea, sleep 05/16/2014  . History of neurodevelopmental disorder 05/16/2014  . Cervical nerve root disorder  02/26/2014  . Essential (primary) hypertension 02/26/2014  . Leg numbness 11/21/2013  . Extreme obesity ([place]) 11/21/2013  . Mechanical and motor problems with internal organs 11/21/2013  . Other general symptoms and signs 11/21/2013  . Morbid obesity ([place]) 11/21/2013  . Spells 11/21/2013  . Adult hypothyroidism 07/18/2013  . Anxiety and depression 05/22/2012    Past Surgical History:  Procedure Laterality Date  . CYSTOSCOPY  05/04/2015   Procedure: CYSTOSCOPY;  Surgeon: Benjaman Kindler, MD;  Location: [facility] ORS;  Service: Gynecology;;  . GALLBLADDER SURGERY    . NECK SURGERY    . ROBOTIC ASSISTED LAPAROSCOPIC HYSTERECTOMY AND SALPINGECTOMY Bilateral 03/30/2015   Procedure: ROBOTIC ASSISTED LAPAROSCOPIC HYSTERECTOMY AND SALPINGECTOMY;  Surgeon: Benjaman Kindler, MD;  Location: [facility] ORS;  Service: Gynecology;  Laterality: Bilateral;  . ROBOTIC ASSISTED TOTAL HYSTERECTOMY WITH SALPINGECTOMY Bilateral 05/04/2015   Procedure: ROBOTIC ASSISTED TOTAL HYSTERECTOMY WITH BILATERAL SALPINGOOPHERECTOMY;  Surgeon: Benjaman Kindler, MD;  Location: [facility] ORS;  Service: Gynecology;  Laterality: Bilateral;  . THYROID SURGERY    . THYROIDECTOMY     total    Allergies Risperidone; Lansoprazole; Nitrofurantoin monohyd macro; Risperidone and related; Tetracycline; and Tetracyclines & related  Social History Social History  Substance Use Topics  . Smoking status: Never Smoker  . Smokeless tobacco: Never Used  . Alcohol use No     Comment: Occasionally     Review of Systems Constitutional: Negative for fever. Cardiovascular: Positive for chest pain Respiratory: Negative for shortness of breath. Gastrointestinal: Negative for abdominal pain, vomiting. Positive for diarrhea Genitourinary: Negative for dysuria. Musculoskeletal: Negative for back pain. Skin: Negative for rash. Neurological: Negative for headaches, focal  weakness or numbness.  10-point ROS otherwise  negative.  ____________________________________________   PHYSICAL EXAM:  VITAL SIGNS: ED Triage Vitals  Enc Vitals Group     BP      Pulse      Resp      Temp      Temp src      SpO2      Weight      Height      Head Circumference      Peak Flow      Pain Score      Pain Loc      Pain Edu?      Excl. in [place]?     Constitutional: Alert and oriented. Well appearing and in no distress. Eyes: Conjunctivae are normal. PERRL. Normal extraocular movements. ENT   Head: Normocephalic and atraumatic.   Nose: No congestion/rhinnorhea.   Mouth/Throat: Mucous membranes are moist.   Neck: No stridor. Cardiovascular: Normal rate, regular rhythm. No murmurs, rubs, or gallops. Respiratory: Normal respiratory effort without tachypnea nor retractions. Breath sounds are clear and equal bilaterally. No wheezes/rales/rhonchi. Gastrointestinal: Soft and nontender. Normal bowel sounds Musculoskeletal: Nontender with normal range of motion in all extremities. No lower extremity tenderness nor edema. Neurologic:  Normal speech and language. No gross focal neurologic deficits are appreciated.  Skin:  Skin is warm, dry and intact. No rash noted. Psychiatric: Bizarre mood and affect at times ____________________________________________  EKG: Interpreted by me. Sinus rhythm rate of 76 bpm, normal PR interval, normal QRS, normal QT, possible RVH with repolarization abnormality. T wave inversions noted  ____________________________________________  ED COURSE:  Pertinent labs & imaging results that were available during my care of the patient were reviewed by me and considered in my medical decision making (see chart for details). Patient presents to ER with chest pain and multiple symptoms likely secondary to schizophrenia. We will assess with labs and imaging. Clinical Course as of Mar 29 1440  Tue Mar 29, 2016  1132 Medically patient appears stable. From a psychiatric standpoint she  appears unstable and will need psychiatric evaluation and possible hospitalization.  [JW]  [id number] Patient planned for admission to the inpatient psychiatric service  [JW]    Clinical Course User Index [JW] Earleen Newport, MD   Procedures ____________________________________________   LABS (pertinent positives/negatives)  Labs Reviewed  COMPREHENSIVE METABOLIC PANEL - Abnormal; Notable for the following:       Result Value   Potassium 3.1 (*)    Glucose, Bld 104 (*)    Total Protein 8.7 (*)    All other components within normal limits  LIPID PANEL - Abnormal; Notable for the following:    Cholesterol 203 (*)    LDL Cholesterol 117 (*)    All other components within normal limits  TSH - Abnormal; Notable for the following:    TSH 9.761 (*)    All other components within normal limits  CBC  TROPONIN I  T4, FREE    RADIOLOGY Images were viewed by me  Chest x-ray IMPRESSION: No acute disease.  Atherosclerosis. ____________________________________________  FINAL ASSESSMENT AND PLAN  Chest pain, schizophrenia  Plan: Patient with labs and imaging as dictated above. Medically patient seems very stable but she appears to be on the verge of psychotic break. Psychiatry will be coming to perform an evaluation. I discussed with family who agree with plan.   Earleen Newport, MD   Note: This note was generated in part or whole with voice recognition software.  Voice recognition is usually quite accurate but there are transcription errors that can and very often do occur. I apologize for any typographical errors that were not detected and corrected.     Earleen Newport, MD 03/29/16 [phone]

## 2016-03-29 NOTE — BH Assessment (Addendum)
Per Marcie Bal Hydrographic surveyor) patient accepted to [facility] Bed 305-A.  Dr. Weber Cooks will IVC the patient.  Writer informed the ER RN Anda Kraft) of the patient disposition.  The accepting physician is Dr. Bary Leriche.

## 2016-03-30 NOTE — ED Notes (Signed)
BEHAVIORAL HEALTH ROUNDING Patient sleeping: No. Patient alert and oriented: yes Behavior appropriate: Yes.  ; If no, describe:  Nutrition and fluids offered: yes Toileting and hygiene offered: Yes  Sitter present: q15 minute observations and security  monitoring Law enforcement present: Yes  ODS  

## 2016-03-30 NOTE — ED Notes (Signed)
Pt has two bags brought from [facility] locker room to go with pt downstairs.

## 2016-03-30 NOTE — ED Notes (Signed)
Pt given breakfast tray due to BMU having to assess the pt at 11a before they can go downstairs. RN is getting pt meds and then pt will be ready to take a shower.

## 2016-03-30 NOTE — ED Notes (Signed)
Pt in no distress. Respirations 24. Pt asleep.

## 2016-03-30 NOTE — ED Notes (Signed)
Pt taking a shower. Linens changed and trash removed from room. Pt is having to be stepped through the process. Pt is unable to remember what to do next. Pt used the bathroom and couldn't remember what to do next which was take her clothes off for the shower. Pt cooperative.

## 2016-03-30 NOTE — ED Notes (Addendum)
Am meds administered as ordered  She denies pain assessment completed

## 2016-03-30 NOTE — ED Notes (Signed)
Patient observed lying in bed with eyes closed  Even, unlabored respirations observed   NAD pt appears to be sleeping  I will continue to monitor along with every 15 minute visual observations and ongoing security monitoring    

## 2016-03-30 NOTE — ED Notes (Signed)
Pt respiration 22. Pulses present.

## 2016-03-30 NOTE — ED Notes (Addendum)
Marissa Quinn - charge in [facility] reports that she will come this am to reassess pt for appropriateness for her unit   Diet ordered  Pt informed of delay in treatment and that her breakfast will be arriving shortly

## 2016-03-30 NOTE — ED Notes (Addendum)
Pt to transfer to [facility] at this time   Report called and given to [employer]

## 2016-03-30 NOTE — BH Assessment (Signed)
Patient has been accepted to [facility].  Patient assigned to [place] Accepting physician is Dr. Ileene Rubens.  Call report to [phone].  Representative was [employer].  ER Staff is aware of it Legrand Pitts, ER Sect.; & Amy T., Patient's Nurse)    Patient's daughter Cordie Grice [phone]) have been updated as well.

## 2016-03-30 NOTE — ED Notes (Signed)
Pt is getting dressed. When ask if she need assistance pt states no she is fine.

## 2016-03-30 NOTE — ED Notes (Addendum)
ED ASSESSMENT - qshift Is the patient under IVC or is there intent for IVC: Yes.   Is the patient medically cleared: Yes.   Is there vacancy in the ED BHU: Yes.   Is the population mix appropriate for patient: Yes.   Is the patient awaiting placement in inpatient or outpatient setting: Yes.  [facility]  Has the patient had a psychiatric consult: Yes.   Survey of unit performed for contraband, proper placement and condition of furniture, tampering with fixtures in bathroom, shower, and each patient room: Yes.  ; Findings:  APPEARANCE/BEHAVIOR Calm and cooperative NEURO ASSESSMENT Orientation: oriented x3  Denies pain Hallucinations: No.None noted (Hallucinations) Speech: Normal Gait: normal RESPIRATORY ASSESSMENT Even  Unlabored respirations  CARDIOVASCULAR ASSESSMENT Pulses equal   regular rate  Skin warm and dry   GASTROINTESTINAL ASSESSMENT no GI complaint EXTREMITIES Full ROM  PLAN OF CARE Provide calm/safe environment. Vital signs assessed twice daily. ED BHU Assessment once each 12-hour shift. Collaborate with TTS  or as condition indicates. Assure the ED provider has rounded once each shift. Provide and encourage hygiene. Provide redirection as needed. Assess for escalating behavior; address immediately and inform ED provider.  Assess family dynamic and appropriateness for visitation as needed: Yes.  ; If necessary, describe findings:  Educate the patient/family about BHU procedures/visitation: Yes.  ; If necessary, describe findings:

## 2016-03-30 NOTE — ED Notes (Signed)
Attempted to call report to Ms Quentin Cornwall - she stated that someone will call me back because they are in report

## 2016-03-30 NOTE — BH Assessment (Signed)
Writer followed up with patient's daughter Cordie Grice [phone]), informed her of patient disposition and pending placement for psych hospitalizations. Daughter was aware of her behaviors on yesterday, that required IM medications.

## 2016-03-30 NOTE — ED Notes (Signed)
Pt given lunch tray.

## 2016-03-30 NOTE — ED Notes (Signed)
Pt is moving downstairs. No breakfast tray brought to ED.

## 2016-03-30 NOTE — ED Notes (Signed)

## 2016-03-30 NOTE — ED Notes (Signed)
Officers picking up pt. Officers received two bags of belongings for this pt.

## 2016-03-30 NOTE — BH Assessment (Signed)
18:15- Writer received phone call from patient's daughter Cordie Grice [phone]) for an update. Writer informed her, the patient was no longer in the ER but transferred to [facility]. Provided her with the number to the nurse's station, for the unit she will be on.

## 2016-03-30 NOTE — ED Notes (Signed)
BEHAVIORAL HEALTH ROUNDING Patient sleeping: No. Patient alert and oriented: yes Behavior appropriate: Yes.  ; If no, describe:  Nutrition and fluids offered: yes Toileting and hygiene offered: Yes  Sitter present: q15 minute observations and security monitoring Law enforcement present: Yes  ODS  Shower completed  Pt was able to complete her am ADL's independently  NAD observed

## 2016-03-30 NOTE — ED Notes (Signed)
Pt lying in bed with lights off.

## 2016-03-30 NOTE — ED Provider Notes (Signed)
Accepted to [facility]   Lavonia Drafts, MD 03/30/16 1436

## 2016-03-30 NOTE — ED Notes (Signed)
Pt asked to establish "Marissa Quinn" as the password and gives verbal consent for her daughter to be told password.

## 2016-03-30 NOTE — ED Notes (Signed)
Tech helped pt get dressed and escorted pt to bed. Pt is in bed lying down.

## 2016-03-30 NOTE — BH Assessment (Signed)
Referral information for Psychiatric Hospitalization faxed to;    [facility] [phone] or [phone]   [facility] Ho[facility] [phone]),    [facility] [phone]),    [address] [phone]),    [facility] [phone])   [facility] [phone]),    [place] [phone] or [phone]),    [facility] [phone]),    [facility] [phone]).

## 2016-03-30 NOTE — ED Notes (Signed)
Pt states she is ok in the shower.

## 2016-03-30 NOTE — ED Notes (Signed)
Pt alert and oriented. Pt calm and cooperative at this time. No needs expressed.

## 2016-03-30 NOTE — ED Notes (Addendum)
Pt awake and verbalizing agreement with the plan to admit her to [facility]  Her daughter Threasa Beards is in the waiting room  - no visit at this time  - she was informed of visiting hours and she called [phone] and spoke with her mother for an update   Pt upset after phone conversation  Plan of care discussed  Reassurance provided  - delay explained to her again

## 2016-03-31 ENCOUNTER — Telehealth: Payer: Self-pay | Admitting: Psychiatry

## 2016-04-07 NOTE — Telephone Encounter (Signed)
oK have reviewed her discharge summary

## 2016-04-11 ENCOUNTER — Encounter: Payer: Self-pay | Admitting: Psychiatry

## 2016-04-11 ENCOUNTER — Ambulatory Visit (INDEPENDENT_AMBULATORY_CARE_PROVIDER_SITE_OTHER): Payer: Medicare Other | Admitting: Psychiatry

## 2016-04-11 VITALS — BP 154/84 | HR 88 | Temp 98.3°F | Wt 224.4 lb

## 2016-04-11 DIAGNOSIS — F4310 Post-traumatic stress disorder, unspecified: Secondary | ICD-10-CM

## 2016-04-11 DIAGNOSIS — F331 Major depressive disorder, recurrent, moderate: Secondary | ICD-10-CM

## 2016-04-11 DIAGNOSIS — F840 Autistic disorder: Secondary | ICD-10-CM

## 2016-04-11 NOTE — Progress Notes (Signed)
Patient ID: Marissa Quinn, female   DOB: 22-Apr-1955, 61 y.o.   MRN: [id number]  [facility] MD/PA/NP OP Progress Note  04/11/2016 2:25 PM Marissa Quinn  MRN:  [id number]  Subjective:  Patient returns for follow-up for major depressive disorder, moderate, recurrent in autistic spectrum disorder. Patient is accompanied by her daughter today. Patient had a episode on March 6 where she had become agitated and had come to the emergency room that resulted in hospitalization at [facility]. Patient today reports that her thyroid medication was changed 2 days prior to this episode. States that the higher dose of the thyroid medication caused her to not sleep or eat for 2 days. She also started having increased flashbacks of her trauma and became very agitated and started to cry. At this point she was brought to the ER and transferred to the psychiatric unit at [facility]. There she was adjusted on her thyroid medication to her previous dose and was started on Vraylar. However patient has not been taking the medicine since she got discharged and she was not given any. She has been off the vraylar for 5 days now and is doing quite well. Her daughter is in an [name] PhD program and she reports that patient's TSH is high but that does not correlate to the levels of thyroid hormone. Her thyroid medication was increased based on these levels which may have contributed to patient's psychiatric decompensation.  Today patient reports doing well. She is concerned about the Haldol given to her in the ER and states that she was quite stiff when she got to [facility]. In reviewing her treatment in the ER it was noted that she was given Haldol 10 mg shot followed in a couple of hours by Geodon 20 mg shot. Not clear why this modality was chosen. Patient today reports that she does not tolerate Haldol well and it made her very sedated.   Chief Complaint: Doing well.  Chief Complaint    Follow-up; Medication  Refill     Visit Diagnosis:     ICD-9-CM ICD-10-CM   1. Major depressive disorder, recurrent episode, moderate (HCC) 296.32 F33.1   2. Autism 299.00 F84.0   3. PTSD (post-traumatic stress disorder) 309.81 F43.10     Past Medical History:  Past Medical History:  Diagnosis Date  . Anxiety   . Asthma   . Depression   . Difficult intubation   . Environmental allergies   . Hypothyroidism   . Sleep apnea   . Thyroid disease     Past Surgical History:  Procedure Laterality Date  . CYSTOSCOPY  05/04/2015   Procedure: CYSTOSCOPY;  Surgeon: Benjaman Kindler, MD;  Location: [facility] ORS;  Service: Gynecology;;  . GALLBLADDER SURGERY    . NECK SURGERY    . ROBOTIC ASSISTED LAPAROSCOPIC HYSTERECTOMY AND SALPINGECTOMY Bilateral 03/30/2015   Procedure: ROBOTIC ASSISTED LAPAROSCOPIC HYSTERECTOMY AND SALPINGECTOMY;  Surgeon: Benjaman Kindler, MD;  Location: [facility] ORS;  Service: Gynecology;  Laterality: Bilateral;  . ROBOTIC ASSISTED TOTAL HYSTERECTOMY WITH SALPINGECTOMY Bilateral 05/04/2015   Procedure: ROBOTIC ASSISTED TOTAL HYSTERECTOMY WITH BILATERAL SALPINGOOPHERECTOMY;  Surgeon: Benjaman Kindler, MD;  Location: [facility] ORS;  Service: Gynecology;  Laterality: Bilateral;  . THYROID SURGERY    . THYROIDECTOMY     total   Family History:  Family History  Problem Relation Age of Onset  . Brain cancer Mother   . Hypertension Brother   . Depression Paternal Grandmother   . Dementia Father   . Alcohol  abuse Father   . Depression Father   . Depression Daughter   . Breast cancer Neg Hx    Social History:  Social History   Social History  . Marital status: Divorced    Spouse name: N/A  . Number of children: N/A  . Years of education: N/A   Social History Main Topics  . Smoking status: Never Smoker  . Smokeless tobacco: Never Used  . Alcohol use No     Comment: Occasionally   . Drug use: No  . Sexual activity: No   Other Topics Concern  . None   Social History Narrative  . None    Additional History:   Assessment:   Musculoskeletal: Strength & Muscle Tone: within normal limits Gait & Station: normal Patient leans: N/A  Psychiatric Specialty Exam: Medication Refill     Review of Systems  Psychiatric/Behavioral: Negative for depression, hallucinations, memory loss, substance abuse and suicidal ideas. The patient is not nervous/anxious and does not have insomnia.   All other systems reviewed and are negative.   Blood pressure (!) 154/84, pulse 88, temperature 98.3 F (36.8 C), temperature source Oral, weight 224 lb 6.4 oz (101.8 kg), last menstrual period 03/24/2011.Body mass index is 37.34 kg/m.  General Appearance: Well Groomed  Eye Contact:  Good  Speech:  Clear and Coherent and Slow  Volume:  Normal  Mood:  improved  Affect:  smiling  Thought Process:  normal  Orientation:  Full (Time, Place, and Person)  Thought Content:  Negative  Suicidal Thoughts:  No  Homicidal Thoughts:  No  Memory:  Immediate;   Good Recent;   Good Remote;   Good  Judgement:  Good  Insight:  Fair  Psychomotor Activity:  Negative  Concentration:  Fair  Recall:  Good  Fund of Knowledge: Fair  Language: Good  Akathisia:  Negative  Handed:  Right   AIMS (if indicated):  Done 07/08/2015 and normal  Assets:  Desire for Improvement  ADL's:  Intact  Cognition: WNL  Sleep:  fair   Is the patient at risk to self?  No. Has the patient been a risk to self in the past 6 months?  No. Has the patient been a risk to self within the distant past?  No. Is the patient a risk to others?  No. Has the patient been a risk to others in the past 6 months?  No. Has the patient been a risk to others within the distant past?  No.  Current Medications: Current Outpatient Prescriptions  Medication Sig Dispense Refill  . albuterol (PROVENTIL HFA;VENTOLIN HFA) 108 (90 BASE) MCG/ACT inhaler Inhale 1 puff into the lungs every 4 (four) hours as needed.     Marland Kitchen aspirin EC 81 MG tablet Take by  mouth.    Marland Kitchen buPROPion (WELLBUTRIN SR) 200 MG 12 hr tablet Take 1 tablet (200 mg total) by mouth every morning. 30 tablet 2  . fluticasone (FLONASE) 50 MCG/ACT nasal spray Place into the nose.    . hydrochlorothiazide (HYDRODIURIL) 12.5 MG tablet Take 12.5 mg by mouth daily.   1  . levocetirizine (XYZAL) 5 MG tablet Take 5 mg by mouth at bedtime.    Marland Kitchen levothyroxine (SYNTHROID, LEVOTHROID) 125 MCG tablet Take 125 mcg by mouth daily.     . Multiple Vitamin (MULTI-VITAMINS) TABS Take 1 tablet by mouth as needed.     . terbinafine (LAMISIL) 250 MG tablet Take 250 mg by mouth daily.     No current facility-administered medications  for this visit.     Medical Decision Making:  Established Problem, Stable/Improving (1)  Treatment Plan Summary:Medication management   Major depressive disorder, moderate, recurrent    continue Wellbutrin to 200mg  daily. Start seeing [facility] weekly. Patient would like to not start vraylar now, would like to monitor and see how she does. Autistic spectrum disorder-stable  Patient will follow up in 1 months and to call before if necessary.    Marissa Quinn 04/11/2016, 2:25 PM

## 2016-04-18 ENCOUNTER — Ambulatory Visit: Payer: Self-pay | Admitting: Licensed Clinical Social Worker

## 2016-04-19 ENCOUNTER — Ambulatory Visit: Payer: Self-pay | Admitting: Licensed Clinical Social Worker

## 2016-04-21 ENCOUNTER — Ambulatory Visit (INDEPENDENT_AMBULATORY_CARE_PROVIDER_SITE_OTHER): Payer: Medicare Other | Admitting: Licensed Clinical Social Worker

## 2016-04-21 DIAGNOSIS — F331 Major depressive disorder, recurrent, moderate: Secondary | ICD-10-CM
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
## 2016-04-28 ENCOUNTER — Ambulatory Visit (INDEPENDENT_AMBULATORY_CARE_PROVIDER_SITE_OTHER): Payer: Medicare Other | Admitting: Licensed Clinical Social Worker

## 2016-04-28 DIAGNOSIS — F840 Autistic disorder: Secondary | ICD-10-CM | POA: Diagnosis not present

## 2016-04-28 DIAGNOSIS — F331 Major depressive disorder, recurrent, moderate: Secondary | ICD-10-CM

## 2016-04-28 NOTE — Progress Notes (Signed)
   THERAPIST PROGRESS NOTE  Session Time: 58min  Participation Level: Active  Behavioral Response: Neat and Well GroomedAlertEuthymic  Type of Therapy: Individual Therapy  Treatment Goals addressed: Coping and Diagnosis: Depression  Interventions: Motivational Interviewing, Solution Focused and Supportive  Summary: Marissa Quinn is a 61 y.o. female who presents with continued symptoms of her diagnosis.  Discussion of her expectations of therapy.  Patient reports that she wants to learn to communicate with others and build relationships.  She reports that it is hard for her to make friends.  She reports wanting to work out and reduce her sadness.  She continues to report a supportive family (daughter only).  She reports that her pastor is a therapist as well.  She reports having sessions with him monthly.  She reports that her thyroid was the cause of her mental breakdown.  LCSW discussed what psychotherapy is and is not and the importance of the therapeutic relationship to include open and honest communication between client and therapist and building trust.  Reviewed advantages and disadvantages of the therapeutic process and limitations to the therapeutic relationship including LCSW's role in maintaining the safety of the client, others and those in client's care.    Suicidal/Homicidal: No  Therapist Response:  Assessed pt current functioning per pt report.  Focused on rapport building w/ pt and exploring w/ pt her tx hx and what she is looking for in counseling currently.  Discussed current symptoms and focused on pt utilizing her strengths.  Developed tx plan w/ pt.  Plan: Return again in 2 weeks.  Diagnosis: Axis I: Depression    Axis II: No diagnosis    Lubertha South, LCSW 04/28/2016

## 2016-05-04 ENCOUNTER — Ambulatory Visit (INDEPENDENT_AMBULATORY_CARE_PROVIDER_SITE_OTHER): Payer: Medicare Other | Admitting: Licensed Clinical Social Worker

## 2016-05-04 DIAGNOSIS — F331 Major depressive disorder, recurrent, moderate: Secondary | ICD-10-CM

## 2016-05-09 NOTE — Progress Notes (Signed)
   THERAPIST PROGRESS NOTE  Session Time: 28min  Participation Level: Active  Behavioral Response: Neat and Well GroomedAlertEuthymic  Type of Therapy: Individual Therapy  Treatment Goals addressed: Coping and Diagnosis: Depression  Interventions: Motivational Interviewing, Solution Focused and Supportive  Summary: Marissa Quinn is a 61 y.o. female who presents with continued symptoms of her diagnosis.  Allowed time for Patient to discuss her current stressors.  Assisted with problem solving the stressors.  Discussion of her main goal at the present time.  Encouraged her to begin working on her goal of body building.  Discussed factors that contribute to client's ongoing depressive symptoms were discussed and include real and perceived feelings of isolation, criticism, rejection, shame and guilt.     Suicidal/Homicidal: No  Therapist Response:LCSW provided Patient with ongoing emotional support and encouragement.  Normalized her feelings.  Commended Patient on her progress and reinforced the importance of client staying focused on her own strengths and resources and resiliency. Processed various strategies for dealing with stressors.    Plan: Return again in 2 weeks.  Diagnosis: Axis I: Depression    Axis II: No diagnosis    Lubertha South, LCSW 05/05/2016

## 2016-05-11 ENCOUNTER — Ambulatory Visit (INDEPENDENT_AMBULATORY_CARE_PROVIDER_SITE_OTHER): Payer: Medicare Other | Admitting: Psychiatry

## 2016-05-11 ENCOUNTER — Encounter: Payer: Self-pay | Admitting: Psychiatry

## 2016-05-11 VITALS — BP 135/86 | HR 90 | Temp 97.6°F | Wt 223.4 lb

## 2016-05-11 DIAGNOSIS — F331 Major depressive disorder, recurrent, moderate: Secondary | ICD-10-CM | POA: Diagnosis not present

## 2016-05-11 DIAGNOSIS — F840 Autistic disorder: Secondary | ICD-10-CM

## 2016-05-11 DIAGNOSIS — F4310 Post-traumatic stress disorder, unspecified: Secondary | ICD-10-CM | POA: Diagnosis not present

## 2016-05-11 MED ORDER — BUPROPION HCL ER (SR) 200 MG PO TB12: 200.0000 mg | ORAL_TABLET | Freq: Every morning | ORAL | 2 refills | Status: DC
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
## 2016-05-12 ENCOUNTER — Ambulatory Visit: Payer: Medicare Other | Admitting: Psychiatry

## 2016-05-13 ENCOUNTER — Ambulatory Visit (INDEPENDENT_AMBULATORY_CARE_PROVIDER_SITE_OTHER): Payer: Medicare Other | Admitting: Licensed Clinical Social Worker

## 2016-05-13 DIAGNOSIS — F840 Autistic disorder: Secondary | ICD-10-CM

## 2016-05-13 DIAGNOSIS — F4310 Post-traumatic stress disorder, unspecified: Secondary | ICD-10-CM

## 2016-05-13 DIAGNOSIS — F331 Major depressive disorder, recurrent, moderate: Secondary | ICD-10-CM | POA: Diagnosis not present

## 2016-05-13 NOTE — Progress Notes (Signed)
   THERAPIST PROGRESS NOTE  Session Time: 37min  Participation Level: Active  Behavioral Response: Neat and Well GroomedAlertNegative and Irritable  Type of Therapy: Family Therapy  Treatment Goals addressed: Coping and Diagnosis: Depression  Interventions: Supportive  Summary: BAYLYN SICKLES is a 62 y.o. female present with her daughter on this day.  Patient was about 45 minutes late for her session.  Therapist agreed to talk with Patient due to a phone call from her Employer earlier this morning.  Employer states she is concerned about behavior of (SI, outburst, calling co workers at [employer]).  Patient was irritable and blunt.  Patient states that she is upset because people take things out of context.  She reports that if she expresses herself then she may become hospitalized.  Patient reports that she had a visit with Dr. Einar Grad 2 days ago that triggered her emotions.  Patient did not want to express her concerns with Therapist stating "you are going to analyze me."  Patient upset that Writer called her yesterday and not Dr. Einar Grad after requesting a call from Dr. Einar Grad.  While expressing herself Patient used negative language and negative tone.        Suicidal/Homicidal: No  Therapist Response:Therapist attempted to calm Patient.  Therapist actively listened and attempt to resolve concerns.  Therapist verbally created safety plan with Patient daughter to contact 911 if SI or HI arise.  Daughter ok with the plan    Plan: Return again in 2 weeks.  Diagnosis: Axis I: Depression    Axis II: No diagnosis    Lubertha South, LCSW 05/13/2016

## 2016-05-17 ENCOUNTER — Ambulatory Visit: Payer: Medicare Other | Admitting: Licensed Clinical Social Worker

## 2016-05-19 DIAGNOSIS — F319 Bipolar disorder, unspecified: Secondary | ICD-10-CM | POA: Insufficient documentation

## 2016-05-31 ENCOUNTER — Ambulatory Visit: Payer: Medicare Other | Admitting: Psychiatry

## 2016-06-01 DIAGNOSIS — I700 Atherosclerosis of aorta: Secondary | ICD-10-CM | POA: Insufficient documentation

## 2016-06-02 ENCOUNTER — Ambulatory Visit: Payer: Medicare Other | Admitting: Licensed Clinical Social Worker

## 2016-06-02 ENCOUNTER — Encounter: Payer: Self-pay | Admitting: Psychiatry

## 2016-06-02 ENCOUNTER — Ambulatory Visit (INDEPENDENT_AMBULATORY_CARE_PROVIDER_SITE_OTHER): Payer: Medicare Other | Admitting: Psychiatry

## 2016-06-02 VITALS — BP 139/82 | HR 75 | Temp 97.7°F | Wt 232.6 lb

## 2016-06-02 DIAGNOSIS — F331 Major depressive disorder, recurrent, moderate: Secondary | ICD-10-CM

## 2016-06-02 DIAGNOSIS — Z915 Personal history of self-harm: Secondary | ICD-10-CM | POA: Insufficient documentation

## 2016-06-02 DIAGNOSIS — F4310 Post-traumatic stress disorder, unspecified: Secondary | ICD-10-CM

## 2016-06-02 DIAGNOSIS — F840 Autistic disorder: Secondary | ICD-10-CM | POA: Diagnosis not present

## 2016-06-02 DIAGNOSIS — Z9151 Personal history of suicidal behavior: Secondary | ICD-10-CM | POA: Insufficient documentation

## 2016-06-02 MED ORDER — ARIPIPRAZOLE 20 MG PO TABS: 20.0000 mg | ORAL_TABLET | Freq: Every day | ORAL | 1 refills | Status: AC

## 2016-06-02 NOTE — Progress Notes (Signed)
Patient ID: Marissa Quinn, female   DOB: 11/10/1955, 61 y.o.   MRN: [id number]  [facility] MD/PA/NP OP Progress Note  06/02/2016 10:50 AM TIFFIANY BEADLES  MRN:  [id number]  Subjective:  Patient returns for follow-up for major depressive disorder, moderate, recurrent in autistic spectrum disorder. Patient was most recently hospitalized at [facility] after she had a manic episode. She was initially taken to the [facility] when this clinician spoke to the social worker there and  discussed patient's condition and medication management. Patient was then started back on Abilify and was discontinued on Wellbutrin. Patient today comes in and states that she is doing better and feels like her manic episode was triggered by some of her trauma in the past. We discussed that she may need a higher level of care given her frequent episodes of decompensation. Patient states that she does not think she requires that level of care and will be changing her treatment team. She stated that she will be seeing Dr. Toy Care starting in June and is here to get a medication refill. Reports doing well since her hospitalization with fair sleep and appetite and denies any problems with her mood or suicidal thoughts.  Chief Complaint: Doing well.  Chief Complaint    Follow-up; Medication Problem     Visit Diagnosis:     ICD-9-CM ICD-10-CM   1. PTSD (post-traumatic stress disorder) 309.81 F43.10   2. Major depressive disorder, recurrent episode, moderate (HCC) 296.32 F33.1   3. Autism 299.00 F84.0     Past Medical History:  Past Medical History:  Diagnosis Date  . Anxiety   . Asthma   . Depression   . Difficult intubation   . Environmental allergies   . Hypothyroidism   . Sleep apnea   . Thyroid disease     Past Surgical History:  Procedure Laterality Date  . CYSTOSCOPY  05/04/2015   Procedure: CYSTOSCOPY;  Surgeon: Benjaman Kindler, MD;  Location: [facility] OR[facility];  Service: Gynecology;;  . GALLBLADDER SURGERY    . NECK SURGERY     . ROBOTIC ASSISTED LAPAROSCOPIC HYSTERECTOMY AND SALPINGECTOMY Bilateral 03/30/2015   Procedure: ROBOTIC ASSISTED LAPAROSCOPIC HYSTERECTOMY AND SALPINGECTOMY;  Surgeon: Benjaman Kindler, MD;  Location: [facility] OR[facility];  Service: Gynecology;  Laterality: Bilateral;  . ROBOTIC ASSISTED TOTAL HYSTERECTOMY WITH SALPINGECTOMY Bilateral 05/04/2015   Procedure: ROBOTIC ASSISTED TOTAL HYSTERECTOMY WITH BILATERAL SALPINGOOPHERECTOMY;  Surgeon: Benjaman Kindler, MD;  Location: [facility] ORS;  Service: Gynecology;  Laterality: Bilateral;  . THYROID SURGERY    . THYROIDECTOMY     total   Family History:  Family History  Problem Relation Age of Onset  . Brain cancer Mother   . Hypertension Brother   . Depression Paternal Grandmother   . Dementia Father   . Alcohol abuse Father   . Depression Father   . Depression Daughter   . Breast cancer Neg Hx    Social History:  Social History   Social History  . Marital status: Divorced    Spouse name: N/A  . Number of children: N/A  . Years of education: N/A   Social History Main Topics  . Smoking status: Never Smoker  . Smokeless tobacco: Never Used  . Alcohol use No     Comment: Occasionally   . Drug use: No  . Sexual activity: No   Other Topics Concern  . None   Social History Narrative  . None   Additional History:   Assessment:   Musculoskeletal: Strength & Muscle Tone: within normal  limits Gait & Station: normal Patient leans: N/A  Psychiatric Specialty Exam: Medication Refill     Review of Systems  Psychiatric/Behavioral: Negative for depression, hallucinations, memory loss, substance abuse and suicidal ideas. The patient is not nervous/anxious and does not have insomnia.   All other systems reviewed and are negative.   Blood pressure 139/82, pulse 75, temperature 97.7 F (36.5 C), temperature source Oral, weight 232 lb 9.6 oz (105.5 kg), last menstrual period 03/24/2011.Body mass index is 38.71 kg/m.  General Appearance: Well Groomed   Eye Contact:  Good  Speech:  Clear and Coherent and Slow  Volume:  Normal  Mood:  improved  Affect:  smiling  Thought Process:  normal  Orientation:  Full (Time, Place, and Person)  Thought Content:  Negative  Suicidal Thoughts:  No  Homicidal Thoughts:  No  Memory:  Immediate;   Good Recent;   Good Remote;   Good  Judgement:  Good  Insight:  Fair  Psychomotor Activity:  Negative  Concentration:  Fair  Recall:  Good  Fund of Knowledge: Fair  Language: Good  Akathisia:  Negative  Handed:  Right   AIMS (if indicated):  na  Assets:  Desire for Improvement  ADL's:  Intact  Cognition: WNL  Sleep:  fair   Is the patient at risk to self?  No. Has the patient been a risk to self in the past 6 months?  No. Has the patient been a risk to self within the distant past?  No. Is the patient a risk to others?  No. Has the patient been a risk to others in the past 6 months?  No. Has the patient been a risk to others within the distant past?  No.  Current Medications: Current Outpatient Prescriptions  Medication Sig Dispense Refill  . albuterol (PROVENTIL HFA;VENTOLIN HFA) 108 (90 BASE) MCG/ACT inhaler Inhale 1 puff into the lungs every 4 (four) hours as needed.     . ARIPiprazole (ABILIFY) 20 MG tablet Take 1 tablet (20 mg total) by mouth daily. 30 tablet 1  . aspirin EC 81 MG tablet Take by mouth.    . beclomethasone (QVAR) 40 MCG/ACT inhaler Inhale 40 mcg into the lungs.    . fluticasone (FLONASE) 50 MCG/ACT nasal spray Place into the nose.    . hydrochlorothiazide (HYDRODIURIL) 12.5 MG tablet Take 12.5 mg by mouth daily.   1  . levocetirizine (XYZAL) 5 MG tablet Take 5 mg by mouth at bedtime.    Marland Kitchen levothyroxine (SYNTHROID, LEVOTHROID) 100 MCG tablet Take by mouth.    . Melatonin 10 MG TABS Take by mouth.    . Multiple Vitamin (MULTI-VITAMINS) TABS Take 1 tablet by mouth as needed.      No current facility-administered medications for this visit.     Medical Decision Making:   Established Problem, Stable/Improving (1)  Treatment Plan Summary:Medication management   Major depressive disorder, moderate, recurrent    Discontinue Wellbutrin (was discontinued during her hospitalization) Continue Abilify at 20 mg daily  PAtient has canceled her therapy appointments with Royal Piedra. She reports today that she will be following up with a different physician and therapist. Patient is not agreeable to a higher level of services including intensive outpatient or partial hospitalization program given that she has had multiple decompensations this year and 2 hospitalizations already.  She reported to this clinician that she will start seeing Dr.Kaur in June and will transfer her care there.  Autistic spectrum disorder-stable  Patient will follow up  with a different different provider per her choice and records will be transferred when she asks for them. We have informed her daughter that patient will no longer be seen at this clinic per her choice,(a consent was signed in March for 2 way communication between daughter and providers at this clinic).    Mcgregor Tinnon 06/02/2016, 10:50 AM

## 2016-06-06 ENCOUNTER — Ambulatory Visit: Payer: Medicare Other | Admitting: Psychiatry

## 2016-07-07 ENCOUNTER — Ambulatory Visit: Payer: Medicare Other | Admitting: Psychiatry

## 2016-10-27 ENCOUNTER — Other Ambulatory Visit
Admission: RE | Admit: 2016-10-27 | Discharge: 2016-10-27 | Disposition: A | Discharge status: Home / Self Care | Payer: Medicare Other | Source: Ambulatory Visit | Attending: Internal Medicine | Admitting: Internal Medicine

## 2016-10-27 DIAGNOSIS — R0789 Other chest pain: Secondary | ICD-10-CM | POA: Insufficient documentation

## 2016-10-27 LAB — TROPONIN I: Troponin I: <0.03 ng/mL (ref <0.03)

## 2016-10-27 LAB — FIBRIN DERIVATIVES D-DIMER (ARMC ONLY): FIBRIN DERIVATIVES D-DIMER (ARMC): 469.45 ng{FEU}/mL (ref 0.00–499.00)

## 2017-02-13 ENCOUNTER — Other Ambulatory Visit: Payer: Self-pay | Admitting: Internal Medicine

## 2017-02-13 DIAGNOSIS — Z1231 Encounter for screening mammogram for malignant neoplasm of breast: Secondary | ICD-10-CM

## 2017-02-27 ENCOUNTER — Ambulatory Visit
Admission: RE | Admit: 2017-02-27 | Discharge: 2017-02-27 | Disposition: A | Discharge status: Home / Self Care | Payer: Medicare Other | Source: Ambulatory Visit | Attending: Internal Medicine | Admitting: Internal Medicine

## 2017-02-27 DIAGNOSIS — Z1231 Encounter for screening mammogram for malignant neoplasm of breast: Secondary | ICD-10-CM | POA: Diagnosis present

## 2017-11-21 ENCOUNTER — Encounter: Payer: Self-pay | Admitting: *Deleted

## 2017-11-22 ENCOUNTER — Ambulatory Visit: Payer: Medicare Other | Admitting: Anesthesiology

## 2017-11-22 ENCOUNTER — Encounter: Payer: Self-pay | Admitting: Anesthesiology

## 2017-11-22 ENCOUNTER — Encounter
Admission: RE | Disposition: A | Discharge status: Home / Self Care | Payer: Self-pay | Source: Ambulatory Visit | Attending: Internal Medicine

## 2017-11-22 ENCOUNTER — Ambulatory Visit
Admission: RE | Admit: 2017-11-22 | Discharge: 2017-11-22 | Disposition: A | Discharge status: Home / Self Care | Payer: Medicare Other | Source: Ambulatory Visit | Attending: Internal Medicine | Admitting: Internal Medicine

## 2017-11-22 DIAGNOSIS — E039 Hypothyroidism, unspecified: Secondary | ICD-10-CM | POA: Diagnosis not present

## 2017-11-22 DIAGNOSIS — I10 Essential (primary) hypertension: Secondary | ICD-10-CM | POA: Insufficient documentation

## 2017-11-22 DIAGNOSIS — Z6838 Body mass index (BMI) 38.0-38.9, adult: Secondary | ICD-10-CM | POA: Insufficient documentation

## 2017-11-22 DIAGNOSIS — Z881 Allergy status to other antibiotic agents status: Secondary | ICD-10-CM | POA: Diagnosis not present

## 2017-11-22 DIAGNOSIS — Z888 Allergy status to other drugs, medicaments and biological substances status: Secondary | ICD-10-CM | POA: Diagnosis not present

## 2017-11-22 DIAGNOSIS — Z1211 Encounter for screening for malignant neoplasm of colon: Secondary | ICD-10-CM | POA: Diagnosis not present

## 2017-11-22 DIAGNOSIS — Z86010 Personal history of colonic polyps: Secondary | ICD-10-CM | POA: Diagnosis not present

## 2017-11-22 DIAGNOSIS — D122 Benign neoplasm of ascending colon: Secondary | ICD-10-CM | POA: Diagnosis not present

## 2017-11-22 DIAGNOSIS — D123 Benign neoplasm of transverse colon: Secondary | ICD-10-CM | POA: Diagnosis not present

## 2017-11-22 DIAGNOSIS — G4730 Sleep apnea, unspecified: Secondary | ICD-10-CM | POA: Insufficient documentation

## 2017-11-22 DIAGNOSIS — M1990 Unspecified osteoarthritis, unspecified site: Secondary | ICD-10-CM | POA: Insufficient documentation

## 2017-11-22 DIAGNOSIS — F419 Anxiety disorder, unspecified: Secondary | ICD-10-CM | POA: Insufficient documentation

## 2017-11-22 DIAGNOSIS — E669 Obesity, unspecified: Secondary | ICD-10-CM | POA: Diagnosis not present

## 2017-11-22 DIAGNOSIS — J45909 Unspecified asthma, uncomplicated: Secondary | ICD-10-CM | POA: Diagnosis not present

## 2017-11-22 DIAGNOSIS — K640 First degree hemorrhoids: Secondary | ICD-10-CM | POA: Insufficient documentation

## 2017-11-22 DIAGNOSIS — F840 Autistic disorder: Secondary | ICD-10-CM | POA: Insufficient documentation

## 2017-11-22 DIAGNOSIS — F319 Bipolar disorder, unspecified: Secondary | ICD-10-CM | POA: Insufficient documentation

## 2017-11-22 DIAGNOSIS — Z79899 Other long term (current) drug therapy: Secondary | ICD-10-CM | POA: Insufficient documentation

## 2017-11-22 DIAGNOSIS — Z7982 Long term (current) use of aspirin: Secondary | ICD-10-CM | POA: Diagnosis not present

## 2017-11-22 HISTORY — DX: Anesthesia of skin: R20.0

## 2017-11-22 HISTORY — DX: Bronchitis, not specified as acute or chronic: J40

## 2017-11-22 HISTORY — DX: Obesity, unspecified: E66.9

## 2017-11-22 HISTORY — DX: Essential (primary) hypertension: I10

## 2017-11-22 HISTORY — DX: Unspecified osteoarthritis, unspecified site: M19.90

## 2017-11-22 HISTORY — PX: COLONOSCOPY WITH PROPOFOL: SHX5780

## 2017-11-22 HISTORY — DX: Anemia, unspecified: D64.9

## 2017-11-22 HISTORY — DX: Personal history of suicidal behavior: Z91.51

## 2017-11-22 HISTORY — DX: Personal history of self-harm: Z91.5

## 2017-11-22 HISTORY — DX: Autistic disorder: F84.0

## 2017-11-22 HISTORY — DX: Bipolar disorder, unspecified: F31.9

## 2017-11-22 SURGERY — COLONOSCOPY WITH PROPOFOL
Anesthesia: General

## 2017-11-22 MED ORDER — PROPOFOL 10 MG/ML IV BOLUS
INTRAVENOUS | Status: AC
  Filled 2017-11-22: qty 20

## 2017-11-22 MED ORDER — PROPOFOL 10 MG/ML IV BOLUS
INTRAVENOUS | Status: DC | PRN
  Administered 2017-11-22: 100 mg via INTRAVENOUS

## 2017-11-22 MED ORDER — PROPOFOL 500 MG/50ML IV EMUL
INTRAVENOUS | Status: AC
  Filled 2017-11-22: qty 50

## 2017-11-22 MED ORDER — SODIUM CHLORIDE 0.9 % IV SOLN
INTRAVENOUS | Status: DC
  Administered 2017-11-22: 10:00:00 via INTRAVENOUS

## 2017-11-22 MED ORDER — PROPOFOL 500 MG/50ML IV EMUL
INTRAVENOUS | Status: DC | PRN
  Administered 2017-11-22: 140 ug/kg/min via INTRAVENOUS

## 2017-11-22 NOTE — Anesthesia Preprocedure Evaluation (Addendum)
Anesthesia Evaluation  Patient identified by MRN, date of birth, ID band Patient awake    Reviewed: Allergy & Precautions, H&P , NPO status , Patient's Chart, lab work & pertinent test results  History of Anesthesia Complications (+) DIFFICULT AIRWAY, DIFFICULT IV STICK / SPECIAL LINE and history of anesthetic complications  Airway Mallampati: III  TM Distance: <3 FB Neck ROM: full    Dental  (+) Chipped   Pulmonary asthma , sleep apnea ,           Cardiovascular Exercise Tolerance: Good hypertension,      Neuro/Psych PSYCHIATRIC DISORDERS  Neuromuscular disease    GI/Hepatic negative GI ROS, Neg liver ROS,   Endo/Other  Hypothyroidism   Renal/GU negative Renal ROS  negative genitourinary   Musculoskeletal  (+) Arthritis ,   Abdominal   Peds  Hematology negative hematology ROS (+)   Anesthesia Other Findings Past Medical History: No date: Anemia No date: Anxiety No date: Arthritis No date: Asthma No date: Autism No date: Bilateral leg numbness No date: Bipolar disorder ([place]) No date: Bronchitis No date: Depression No date: Difficult intubation No date: Environmental allergies No date: History of attempted suicide No date: Hypertension No date: Hypothyroidism No date: Obesity No date: Sleep apnea No date: Sleep apnea No date: Thyroid disease  Past Surgical History: No date: ABDOMINAL HYSTERECTOMY No date: CARPAL TUNNEL RELEASE No date: CHOLECYSTECTOMY No date: COLONOSCOPY 05/04/2015: CYSTOSCOPY     Comment:  Procedure: CYSTOSCOPY;  Surgeon: Benjaman Kindler, MD;                Location: [facility] ORS;  Service: Gynecology;; No date: GALLBLADDER SURGERY No date: NECK SURGERY 03/30/2015: ROBOTIC ASSISTED LAPAROSCOPIC HYSTERECTOMY AND  SALPINGECTOMY; Bilateral     Comment:  Procedure: ROBOTIC ASSISTED LAPAROSCOPIC HYSTERECTOMY               AND SALPINGECTOMY;  Surgeon: Benjaman Kindler, MD;    Location: [facility] OR[facility];  Service: Gynecology;  Laterality:               Bilateral; 05/04/2015: ROBOTIC ASSISTED TOTAL HYSTERECTOMY WITH SALPINGECTOMY;  Bilateral     Comment:  Procedure: ROBOTIC ASSISTED TOTAL HYSTERECTOMY WITH               BILATERAL SALPINGOOPHERECTOMY;  Surgeon: Benjaman Kindler,              MD;  Location: [facility] OR[facility];  Service: Gynecology;                Laterality: Bilateral; No date: THYROID SURGERY No date: THYROIDECTOMY     Comment:  total     Reproductive/Obstetrics negative OB ROS                            Anesthesia Physical Anesthesia Plan  ASA: III  Anesthesia Plan: General   Post-op Pain Management:    Induction: Intravenous  PONV Risk Score and Plan: Propofol infusion and TIVA  Airway Management Planned: Natural Airway and Nasal Cannula  Additional Equipment:   Intra-op Plan:   Post-operative Plan:   Informed Consent: I have reviewed the patients History and Physical, chart, labs and discussed the procedure including the risks, benefits and alternatives for the proposed anesthesia with the patient or authorized representative who has indicated his/her understanding and acceptance.   Dental Advisory Given  Plan Discussed with: Anesthesiologist, CRNA and Surgeon  Anesthesia Plan Comments: (Patient consented for risks of anesthesia including but not limited  to:  - adverse reactions to medications - risk of intubation if required - damage to teeth, lips or other oral mucosa - sore throat or hoarseness - Damage to heart, brain, lungs or loss of life  Patient voiced understanding.)        Anesthesia Quick Evaluation

## 2017-11-22 NOTE — Anesthesia Postprocedure Evaluation (Signed)
Anesthesia Post Note  Patient: Marissa Quinn  Procedure(s) Performed: COLONOSCOPY WITH PROPOFOL (N/A )  Patient location during evaluation: Endoscopy Anesthesia Type: General Level of consciousness: awake and alert Pain management: pain level controlled Vital Signs Assessment: post-procedure vital signs reviewed and stable Respiratory status: spontaneous breathing, nonlabored ventilation, respiratory function stable and patient connected to nasal cannula oxygen Cardiovascular status: blood pressure returned to baseline and stable Postop Assessment: no apparent nausea or vomiting Anesthetic complications: no     Last Vitals:  Vitals:   11/22/17 1110 11/22/17 1120  BP: 119/79 127/83  Pulse: 67 67  Resp: 14 16  Temp:    SpO2: 100% 100%    Last Pain:  Vitals:   11/22/17 1100  TempSrc: Tympanic                 Precious Haws Piscitello

## 2017-11-22 NOTE — Interval H&P Note (Signed)
History and Physical Interval Note:  11/22/2017 8:51 AM  Marissa Quinn  has presented today for surgery, with the diagnosis of PH COLON POLYPS  The various methods of treatment have been discussed with the patient and family. After consideration of risks, benefits and other options for treatment, the patient has consented to  Procedure(s): COLONOSCOPY WITH PROPOFOL (N/A) as a surgical intervention .  The patient's history has been reviewed, patient examined, no change in status, stable for surgery.  I have reviewed the patient's chart and labs.  Questions were answered to the patient's satisfaction.     [place], [place]

## 2017-11-22 NOTE — Brief Op Note (Deleted)
Erroneous entry for procedure time out for this pt

## 2017-11-22 NOTE — Transfer of Care (Signed)
Immediate Anesthesia Transfer of Care Note  Patient: Marissa Quinn  Procedure(s) Performed: COLONOSCOPY WITH PROPOFOL (N/A )  Patient Location: Endoscopy Unit  Anesthesia Type:General  Level of Consciousness: sedated  Airway & Oxygen Therapy: Patient Spontanous Breathing and Patient connected to nasal cannula oxygen  Post-op Assessment: Report given to RN and Post -op Vital signs reviewed and stable  Post vital signs: Reviewed and stable  Last Vitals:  Vitals Value Taken Time  BP    Temp    Pulse 68 11/22/2017 11:06 AM  Resp 13 11/22/2017 11:06 AM  SpO2 100 % 11/22/2017 11:06 AM  Vitals shown include unvalidated device data.  Last Pain:  Vitals:   11/22/17 1100  TempSrc: (P) Tympanic         Complications: No apparent anesthesia complications

## 2017-11-22 NOTE — Anesthesia Post-op Follow-up Note (Signed)
Anesthesia QCDR form completed.        

## 2017-11-22 NOTE — Interval H&P Note (Signed)
History and Physical Interval Note:  11/22/2017 9:50 AM  Marissa Quinn  has presented today for surgery, with the diagnosis of PH COLON POLYPS  The various methods of treatment have been discussed with the patient and family. After consideration of risks, benefits and other options for treatment, the patient has consented to  Procedure(s): COLONOSCOPY WITH PROPOFOL (N/A) as a surgical intervention .  The patient's history has been reviewed, patient examined, no change in status, stable for surgery.  I have reviewed the patient's chart and labs.  Questions were answered to the patient's satisfaction.     [place], [place]

## 2017-11-22 NOTE — H&P (Signed)
Outpatient short stay form Pre-procedure 11/22/2017 8:50 AM Marissa Quinn K. [name], [name]  Primary Physician: Marissa Quinn, [name]  Reason for visit:  Personal hx of adenomatous polyps of the colon.  History of present illness:  Patient presents for colonoscopy for colon polyp surveillance. The patient denies complaints of abdominal pain, significant change in bowel habits, or rectal bleeding.    No current facility-administered medications for this encounter.   Medications Prior to Admission  Medication Sig Dispense Refill Last Dose  . buPROPion (WELLBUTRIN SR) 150 MG 12 hr tablet Take 150 mg by mouth daily.     . Cholecalciferol 1000 units TBDP Take 1,000 Units by mouth daily.     Marland Kitchen albuterol (PROVENTIL HFA;VENTOLIN HFA) 108 (90 BASE) MCG/ACT inhaler Inhale 1 puff into the lungs every 4 (four) hours as needed.    Taking  . ARIPiprazole (ABILIFY) 20 MG tablet Take 1 tablet (20 mg total) by mouth daily. 30 tablet 1   . aspirin EC 81 MG tablet Take by mouth.   Taking  . beclomethasone (QVAR) 40 MCG/ACT inhaler Inhale 40 mcg into the lungs.   Taking  . fluticasone (FLONASE) 50 MCG/ACT nasal spray Place into the nose.   Taking  . hydrochlorothiazide (HYDRODIURIL) 12.5 MG tablet Take 12.5 mg by mouth daily.   1 Taking  . levocetirizine (XYZAL) 5 MG tablet Take 5 mg by mouth at bedtime.   Taking  . levothyroxine (SYNTHROID, LEVOTHROID) 100 MCG tablet Take by mouth.   Taking  . Multiple Vitamin (MULTI-VITAMINS) TABS Take 1 tablet by mouth as needed.    Taking     Allergies  Allergen Reactions  . Haloperidol Other (See Comments)    Other reaction(s): Other (See Comments) Eyes fixated, facial movements off Eyes fixated, facial movements off  . Risperidone Anaphylaxis  . Demeclocycline     Other reaction(s): Other (See Comments) Allergy per patient--reaction unknown  . Haloperidol Lactate     Eyes fixated, facial movements off  . Lansoprazole     Other reaction(s): Unknown  .  Nitrofurantoin Nausea Only  . Nitrofurantoin Monohyd Macro Nausea Only  . Risperidone And Related   . Tetracycline   . Tetracyclines & Related     Other reaction(s): Other (See Comments) Allergy per patient--reaction unknown     Past Medical History:  Diagnosis Date  . Anemia   . Anxiety   . Arthritis   . Asthma   . Autism   . Bilateral leg numbness   . Bipolar disorder ([place])   . Bronchitis   . Depression   . Difficult intubation   . Environmental allergies   . History of attempted suicide   . Hypertension   . Hypothyroidism   . Obesity   . Sleep apnea   . Sleep apnea   . Thyroid disease     Review of systems:  Otherwise negative.    Physical Exam  Gen: Alert, oriented. Appears stated age.  HEENT: [place]/AT. PERRLA. Lungs: CTA, no wheezes. CV: RR nl S1, S2. Abd: soft, benign, no masses. BS+ Ext: No edema. Pulses 2+    Planned procedures: Proceed with colonoscopy. The patient understands the nature of the planned procedure, indications, risks, alternatives and potential complications including but not limited to bleeding, infection, perforation, damage to internal organs and possible oversedation/side effects from anesthesia. The patient agrees and gives consent to proceed.  Please refer to procedure notes for findings, recommendations and patient disposition/instructions.     Marissa Quinn K. [name], [name] Gastroenterology 11/22/2017  8:50 AM

## 2017-11-22 NOTE — Brief Op Note (Signed)
Erroneous entries for pre-procedure and procedure time outs and dilatation submitted by RN for this pt. This pt was not dilated

## 2017-11-22 NOTE — Op Note (Signed)
[facility] Gastroenterology Patient Name: Marissa Quinn Procedure Date: 11/22/2017 9:26 AM MRN: [id number] Account #: [id number] Date of Birth: 12-04-1955 Admit Type: Outpatient Age: 62 Room: [facility] ENDO ROOM 3 Gender: Female Note Status: Finalized Procedure:            Colonoscopy Indications:          High risk colon cancer surveillance: Personal history                        of colonic polyps Providers:            Benay Pike. [name] MD, MD Referring MD:         Ocie Cornfield. Ouida Sills MD, MD (Referring MD) Medicines:            Propofol per Anesthesia Complications:        No immediate complications. Procedure:            Pre-Anesthesia Assessment:                       - The risks and benefits of the procedure and the                        sedation options and risks were discussed with the                        patient. All questions were answered and informed                        consent was obtained.                       - Patient identification and proposed procedure were                        verified prior to the procedure by the nurse. The                        procedure was verified in the procedure room.                       - ASA Grade Assessment: III - A patient with severe                        systemic disease.                       - After reviewing the risks and benefits, the patient                        was deemed in satisfactory condition to undergo the                        procedure.                       After obtaining informed consent, the colonoscope was                        passed under direct vision. Throughout the procedure,  the patient's blood pressure, pulse, and oxygen                        saturations were monitored continuously. The                        Colonoscope was introduced through the anus and                        advanced to the the cecum, identified by the   appendiceal orifice, ileocecal valve and palpation. The                        colonoscopy was performed without difficulty. The                        patient tolerated the procedure well. The quality of                        the bowel preparation was excellent. The ileocecal                        valve, appendiceal orifice, and rectum were                        photographed. Findings:      The perianal and digital rectal examinations were normal. Pertinent       negatives include normal sphincter tone and no palpable rectal lesions.      A 9 mm polyp was found in the ascending colon. The polyp was       semi-pedunculated. The polyp was removed with a hot snare. Resection and       retrieval were complete. To prevent bleeding after the polypectomy, one       hemostatic clip was successfully placed (MR conditional). There was no       bleeding during, or at the end, of the procedure.      A 4 mm polyp was found in the hepatic flexure. The polyp was sessile.       The polyp was removed with a cold snare. Resection and retrieval were       complete.      Non-bleeding internal hemorrhoids were found during retroflexion. The       hemorrhoids were Grade I (internal hemorrhoids that do not prolapse).      The exam was otherwise without abnormality. Impression:           - One 9 mm polyp in the ascending colon, removed with a                        hot snare. Resected and retrieved. Clip (MR                        conditional) was placed.                       - One 4 mm polyp at the hepatic flexure, removed with a                        cold snare. Resected and retrieved.                       -  Non-bleeding internal hemorrhoids.                       - The examination was otherwise normal. Recommendation:       - Patient has a contact number available for                        emergencies. The signs and symptoms of potential                        delayed complications were discussed  with the patient.                        Return to normal activities tomorrow. Written discharge                        instructions were provided to the patient.                       - Resume previous diet.                       - Continue present medications.                       - Repeat colonoscopy is recommended for surveillance.                        The colonoscopy date will be determined after pathology                        results from today's exam become available for review.                       - Return to GI office PRN.                       - The findings and recommendations were discussed with                        the patient and their family. Procedure Code(s):    --- Professional ---                       [phone], Colonoscopy, flexible; with removal of tumor(s),                        polyp(s), or other lesion(s) by snare technique Diagnosis Code(s):    --- Professional ---                       K64.0, First degree hemorrhoids                       D12.3, Benign neoplasm of transverse colon (hepatic                        flexure or splenic flexure)                       D12.2, Benign neoplasm of ascending colon  Z86.010, Personal history of colonic polyps CPT copyright 2018 American Medical Association. All rights reserved. The codes documented in this report are preliminary and upon coder review may  be revised to meet current compliance requirements. Efrain Sella MD, MD 11/22/2017 11:05:23 AM This report has been signed electronically. Number of Addenda: 0 Note Initiated On: 11/22/2017 9:26 AM Scope Withdrawal Time: 0 hours 9 minutes 50 seconds  Total Procedure Duration: 0 hours 14 minutes 26 seconds       [facility]

## 2017-11-23 ENCOUNTER — Encounter: Payer: Self-pay | Admitting: Internal Medicine

## 2017-11-25 LAB — SURGICAL PATHOLOGY

## 2018-02-14 ENCOUNTER — Other Ambulatory Visit: Payer: Self-pay | Admitting: Internal Medicine

## 2018-02-14 DIAGNOSIS — Z1231 Encounter for screening mammogram for malignant neoplasm of breast: Secondary | ICD-10-CM

## 2018-03-29 ENCOUNTER — Ambulatory Visit
Admission: RE | Admit: 2018-03-29 | Discharge: 2018-03-29 | Disposition: A | Discharge status: Home / Self Care | Payer: Medicare Other | Source: Ambulatory Visit | Attending: Internal Medicine | Admitting: Internal Medicine

## 2018-03-29 DIAGNOSIS — Z1231 Encounter for screening mammogram for malignant neoplasm of breast: Secondary | ICD-10-CM | POA: Insufficient documentation

## 2019-03-08 ENCOUNTER — Other Ambulatory Visit: Payer: Self-pay | Admitting: Internal Medicine

## 2019-03-08 DIAGNOSIS — Z1231 Encounter for screening mammogram for malignant neoplasm of breast: Secondary | ICD-10-CM

## 2019-03-29 ENCOUNTER — Ambulatory Visit: Payer: Medicare Other | Admitting: Dietician

## 2019-04-15 ENCOUNTER — Other Ambulatory Visit: Payer: Self-pay | Admitting: Neurology

## 2019-04-15 DIAGNOSIS — R259 Unspecified abnormal involuntary movements: Secondary | ICD-10-CM

## 2019-04-25 ENCOUNTER — Ambulatory Visit
Admission: RE | Admit: 2019-04-25 | Discharge: 2019-04-25 | Disposition: A | Discharge status: Home / Self Care | Payer: Medicare Other | Source: Ambulatory Visit | Attending: Neurology | Admitting: Neurology

## 2019-04-25 ENCOUNTER — Other Ambulatory Visit: Payer: Self-pay

## 2019-04-25 DIAGNOSIS — R259 Unspecified abnormal involuntary movements: Secondary | ICD-10-CM | POA: Insufficient documentation

## 2019-04-25 LAB — POCT I-STAT CREATININE: Creatinine, Ser: 1 mg/dL (ref 0.44–1.00)

## 2019-04-25 MED ORDER — GADOBUTROL 1 MMOL/ML IV SOLN
10.0000 mL | Freq: Once | INTRAVENOUS | Status: AC | PRN
  Administered 2019-04-25: 10 mL via INTRAVENOUS

## 2019-05-03 ENCOUNTER — Ambulatory Visit
Admission: RE | Admit: 2019-05-03 | Discharge: 2019-05-03 | Disposition: A | Discharge status: Home / Self Care | Payer: Medicare Other | Source: Ambulatory Visit | Attending: Internal Medicine | Admitting: Internal Medicine

## 2019-05-03 DIAGNOSIS — Z1231 Encounter for screening mammogram for malignant neoplasm of breast: Secondary | ICD-10-CM | POA: Diagnosis present

## 2019-05-08 ENCOUNTER — Other Ambulatory Visit: Payer: Self-pay | Admitting: Internal Medicine

## 2019-05-08 DIAGNOSIS — R928 Other abnormal and inconclusive findings on diagnostic imaging of breast: Secondary | ICD-10-CM

## 2019-05-20 ENCOUNTER — Ambulatory Visit
Admission: RE | Admit: 2019-05-20 | Discharge: 2019-05-20 | Disposition: A | Discharge status: Home / Self Care | Payer: Medicare Other | Source: Ambulatory Visit | Attending: Internal Medicine | Admitting: Internal Medicine

## 2019-05-20 DIAGNOSIS — R928 Other abnormal and inconclusive findings on diagnostic imaging of breast: Secondary | ICD-10-CM | POA: Diagnosis present

## 2020-03-03 ENCOUNTER — Other Ambulatory Visit: Payer: Self-pay | Admitting: Internal Medicine

## 2020-03-03 DIAGNOSIS — Z1231 Encounter for screening mammogram for malignant neoplasm of breast: Secondary | ICD-10-CM

## 2020-05-25 ENCOUNTER — Ambulatory Visit
Admission: RE | Admit: 2020-05-25 | Discharge: 2020-05-25 | Disposition: A | Discharge status: Home / Self Care | Payer: Medicare Other | Source: Ambulatory Visit | Attending: Internal Medicine | Admitting: Internal Medicine

## 2020-05-25 ENCOUNTER — Other Ambulatory Visit: Payer: Self-pay

## 2020-05-25 DIAGNOSIS — Z1231 Encounter for screening mammogram for malignant neoplasm of breast: Secondary | ICD-10-CM | POA: Diagnosis not present

## 2020-05-28 ENCOUNTER — Other Ambulatory Visit: Payer: Self-pay | Admitting: Surgery

## 2020-05-28 ENCOUNTER — Other Ambulatory Visit (HOSPITAL_COMMUNITY): Payer: Self-pay | Admitting: Surgery

## 2020-06-01 ENCOUNTER — Ambulatory Visit: Payer: Medicare Other | Attending: Internal Medicine

## 2020-06-01 DIAGNOSIS — I10 Essential (primary) hypertension: Secondary | ICD-10-CM | POA: Insufficient documentation

## 2020-06-01 DIAGNOSIS — R400 Somnolence: Secondary | ICD-10-CM | POA: Diagnosis present

## 2020-06-01 DIAGNOSIS — E669 Obesity, unspecified: Secondary | ICD-10-CM | POA: Insufficient documentation

## 2020-06-01 DIAGNOSIS — R0683 Snoring: Secondary | ICD-10-CM | POA: Diagnosis not present

## 2020-06-02 ENCOUNTER — Other Ambulatory Visit: Payer: Self-pay

## 2020-06-08 ENCOUNTER — Other Ambulatory Visit: Payer: Self-pay

## 2020-06-08 ENCOUNTER — Ambulatory Visit
Admission: RE | Admit: 2020-06-08 | Discharge: 2020-06-08 | Disposition: A | Discharge status: Home / Self Care | Payer: Medicare Other | Source: Ambulatory Visit | Attending: Surgery | Admitting: Surgery

## 2020-06-08 DIAGNOSIS — Z01810 Encounter for preprocedural cardiovascular examination: Secondary | ICD-10-CM

## 2020-06-10 ENCOUNTER — Ambulatory Visit (HOSPITAL_COMMUNITY): Payer: Medicare Other

## 2020-07-01 ENCOUNTER — Other Ambulatory Visit: Payer: Self-pay

## 2020-07-01 ENCOUNTER — Encounter: Payer: Medicare Other | Attending: Surgery | Admitting: Dietician

## 2020-07-01 ENCOUNTER — Encounter: Payer: Self-pay | Admitting: Dietician

## 2020-07-01 VITALS — Ht 63.0 in | Wt 210.4 lb

## 2020-07-01 DIAGNOSIS — R7303 Prediabetes: Secondary | ICD-10-CM | POA: Diagnosis not present

## 2020-07-01 DIAGNOSIS — Z6838 Body mass index (BMI) 38.0-38.9, adult: Secondary | ICD-10-CM | POA: Diagnosis not present

## 2020-07-01 DIAGNOSIS — Z6837 Body mass index (BMI) 37.0-37.9, adult: Secondary | ICD-10-CM

## 2020-07-01 DIAGNOSIS — Z833 Family history of diabetes mellitus: Secondary | ICD-10-CM | POA: Diagnosis not present

## 2020-07-01 DIAGNOSIS — E669 Obesity, unspecified: Secondary | ICD-10-CM

## 2020-07-01 NOTE — Patient Instructions (Signed)
   Plan to have a meal or snack every 5 hours during the day.   Allow for a small serving of whole grain bread, oatmeal, brown rice, or other whole grain, beans, or fruit with each meal or snack.   Great job drinking plenty of water and eating healthy protein foods!  Talk with therapist for ways to control binge eating. Eating on a regular schedule also helps.

## 2020-07-01 NOTE — Progress Notes (Signed)
Nutrition Assessment for Bariatric Surgery   Appointment start time: 1500   end time: 1615  Planned Surgery: sleeve gastrectomy  Anthropometrics: Weight: 210.4lbs Height: 5'3" BMI: 37.27   Patient's Goal Weight: 125lbs  Clinical: Medical History: HTN, hyperlipidemia, asthma; history of sleep apnea resolved with weight loss. Thyroid removed about 2017; also hx of hysterectomy, cholecystectomy. Autism spectrum, hx of neurodevelopmental disorder.  Medications: albuterol, aspirin, atorvastatin, beclomethasone, buPROPion, celecoxib, citalopram, fluticasone, HCTZ, levocetirizine, levothyroxine, spironolactone  Supplements: calcium, multivitamin, vitamin B12, vitamin C, vitamin D  Relevant labs: HbA1C 5.5% 07/01/20; total cholesterol 141, LDL 57, HDL 72.9, triglycerides 54; TSH 2.5 06/08/20 Notable symptoms: none per patient  Drug allergies: haloperidol, risperidone, demeclocycline, lansoprazole, nitrofurantoin, tetracycline  Food allergies: none known  Lifestyle and Dietary History:  Dieting/ weight history:  Patient has participated in weight watchers in the past, but regained weight lost. Has also tried slim fast with little success She was last at her goal weight at about age 65. Has increased fiber intake and drinking more water Uses MyFitnessPal to log intake and activity She has increased walking/ exercise, started intermittent fasting, and has begun to lose weight -- about 7-8lbs in the past 1-2 months Does not each much meat due to required cooking; does occasionally eat low sodium deli meat, bacon crumbles in salad or with eggs (7g protein)     Disordered or emotional eating history:  Patient reports history of binge eating/ no formal diagnosis.  Might overeat specific foods such as bread, raisins, especially when bored.  She now avoids buying trigger foods, has discussed eating behavior with therapist.  Physical activity: walking, stationary bike 60-90 minutes  daily  Dietary Recall:  Daily pattern: 2 meals and 0 snacks. Dining out: 0 meals per week. Breakfast: 12-1pm -- cottage cheese, egg whites, yogurt  Snack: none Lunch: has been skipping for past 1-2 months Snack: none Supper: 4:30-5pm salad with fat free [place] dressing, Dave's multigrain and seeds bread; oatmeal with nuts and raisins; usually weighs foods Snack: none Beverages: water 80-100oz daily some with propel mix-in, 1c coffee in am, sometimes Fairlife milk   Nutrition Intervention: Instructed patient on pre-op diet goals and importance of close adherence to bariatric diet after surgery to avoid side effects and complications.  Discussed stages of the bariatric diet after surgery as well as the importance of adequate protein and fluid intake.  Instructed on purpose of and scheduled for pre-op diet.  Discussed protein choices and healthy carb choices, and appropriate balance during pre-op phase.  Nutrition Diagnosis: [place]-3.3 Overweight/ obesity related to history of excess calories and inadequate physical activity as evidenced by patient with current BMI of 37.27, following dietary guidelines for weight loss prior to bariatric surgery.  Teaching method(s) utilized: Copy provided: Pre-op Goals Diet Stage Template Plate planner with food lists, low carb meal pattern Tips for managing food cravings Visit summary with goals/ instructions  Learning Readiness: Change in progress  Barriers to learning/ implementing lifestyle change: none  Demonstrated degree of understanding via: Teach Back  Summary: Patient has begun making significant diet and lifestyle changes in effort to lose weight and prepare for bariatric surgery.  Patient's goal for weight loss is 85lbs, to goal of 125lbs (BMI 22-23). She has solid support from family.  She agrees to work on adequate nutritional intake and eating at regular intervals prior to surgery. She will also  plan to speak further with her therapist regarding binge eating/ food cravings. She agrees to attend 6  monthly weight loss visits prior to surgery. She is motivated to follow the bariatric diet after surgery.  From a nutrition standpoint, she is ready to proceed with the bariatric surgery program, and will be a good candidate for surgery.    Plan: Patient commits to returning for 6 weight loss visits prior to surgery.  She will plan to return for post-op RD visits beginning 2 weeks after surgery.

## 2020-07-22 ENCOUNTER — Ambulatory Visit: Payer: Medicare Other | Admitting: Dietician

## 2020-08-17 ENCOUNTER — Encounter: Payer: Medicare Other | Attending: Surgery | Admitting: Dietician

## 2020-08-17 ENCOUNTER — Encounter: Payer: Self-pay | Admitting: Dietician

## 2020-08-17 ENCOUNTER — Other Ambulatory Visit: Payer: Self-pay

## 2020-08-17 VITALS — Ht 63.0 in | Wt 208.0 lb

## 2020-08-17 DIAGNOSIS — R7303 Prediabetes: Secondary | ICD-10-CM | POA: Diagnosis not present

## 2020-08-17 DIAGNOSIS — Z6837 Body mass index (BMI) 37.0-37.9, adult: Secondary | ICD-10-CM | POA: Insufficient documentation

## 2020-08-17 DIAGNOSIS — E669 Obesity, unspecified: Secondary | ICD-10-CM

## 2020-08-17 DIAGNOSIS — G4733 Obstructive sleep apnea (adult) (pediatric): Secondary | ICD-10-CM | POA: Diagnosis not present

## 2020-08-17 NOTE — Patient Instructions (Addendum)
Continue to control carb foods (starches, fruits) and keep to about 1 serving with each meal. 1 serving = 1/2 cup of starchy food or 1 slice bread, or 5-6 plain crackers, 1 small piece of fresh fruit or 1 small slice of watermelon or cantaloupe (1/2 - 1 cup total of fresh fruit, 4-8oz). Great job making healthy food choices and eating lots of vegetables along with lean protein.  Eat a snack before exercising and eat a fruit or drink some milk afterwards, or eat breakfast.

## 2020-08-17 NOTE — Progress Notes (Signed)
Supervised Weight Loss for Bariatric Surgery Appt start time: [id number] end time:  1615  SWL Appointment 1 of 6   Anthropometrics Start Weight at NDES: 210.4lbs  (07/01/20) Weight: 208.0lbs Height: 5'3"  BMI: 36.85  Clinical Medications: no changes since previous visit, per patient Health/ medical history changes: no changes per patient  Dietary/ Lifestyle Progress: Denies any binge eating since prior to previous visit. Plans meals at least one day in advance and weighs foods regularly. She has spent 2 weeks in [place], [place], during which time she felt she was somewhat less active, and ate some higher calorie foods such as pizza on occasion.    Dietary intake: Eating Pattern: 3 meals and 1 snacks daily Dining out: not assessed today  Breakfast: 12pm cottage cheese, yogurt, or egg whites; [place] sausage and hash browns  Snack: none  Lunch: 2pm -- large salad with tuna, cheese, dressing Snack: none Dinner: salad; peanut butter on Dave's Good Seed bread, no jelly Snack: occasionally raisins 28g --weighs on food scale Beverages: water 80-100oz daily; 1c coffee in am, black; sometimes Fairlife milk  Usual physical activity: walking or stationary bike 60-90 minutes 6 days per week    Intervention:   Nutrition counseling for weight loss/ control prior to upcoming bariatric surgery. Discussed inclusion of at least 15g carbohydrate with meals to fuel exercise as patient is engaging in significant duration most days; discussed healthy choices and appropriate portions.  Patient has included some of her "trigger" foods but has controlled portions and avoided large amounts or binges. This shows likelihood that patient will be able to follow post-op diet closely.                Nutritional Diagnosis:  -3.3 Overweight/obesity related to history of excess calories and inadequate physical activity as evidenced by patient with current BMI of 36.85, following dietary guidelines for weight loss/  control prior to bariatric surgery.               Teaching Method Utilized:  Visual Auditory Hands on  Materials provided: Visit summary with goals/ instructions  Learning Readiness:  Change in progress  Barriers to learning/adherence to lifestyle change: none  Demonstrated degree of understanding via:  Teach Back   Plan:   09/17/20 at 5:15pm

## 2020-09-17 ENCOUNTER — Ambulatory Visit: Payer: Medicare Other | Admitting: Dietician

## 2020-09-17 ENCOUNTER — Other Ambulatory Visit
Admission: RE | Admit: 2020-09-17 | Discharge: 2020-09-17 | Disposition: A | Discharge status: Home / Self Care | Payer: Medicare Other | Source: Ambulatory Visit | Attending: Family Medicine | Admitting: Family Medicine

## 2020-09-17 DIAGNOSIS — R079 Chest pain, unspecified: Secondary | ICD-10-CM | POA: Diagnosis present

## 2020-09-17 LAB — TROPONIN I (HIGH SENSITIVITY): Troponin I (High Sensitivity): 10 ng/L (ref <18)

## 2020-09-21 ENCOUNTER — Encounter: Payer: Self-pay | Admitting: Dietician

## 2020-09-21 ENCOUNTER — Encounter: Payer: Medicare Other | Attending: Surgery | Admitting: Dietician

## 2020-09-21 ENCOUNTER — Other Ambulatory Visit: Payer: Self-pay

## 2020-09-21 VITALS — Ht 63.0 in | Wt 215.0 lb

## 2020-09-21 DIAGNOSIS — E669 Obesity, unspecified: Secondary | ICD-10-CM | POA: Diagnosis not present

## 2020-09-21 DIAGNOSIS — Z6838 Body mass index (BMI) 38.0-38.9, adult: Secondary | ICD-10-CM | POA: Insufficient documentation

## 2020-09-21 DIAGNOSIS — R7303 Prediabetes: Secondary | ICD-10-CM | POA: Insufficient documentation

## 2020-09-21 NOTE — Patient Instructions (Addendum)
Try having 1/2 of a protein shake in the morning, between 6 and 8 am ideally. This will help avoid a long stretch of time with an empty stomach. Finish the rest of the shake for a later snack.  Eat a small snack before exercising for 1 hour or longer. A small portion of raisins or 1/2 banana is fine.  After exercise, have peanut butter crackers or 1/2 sandwich or fruit with cheese or yogurt (combine some carb and protein)

## 2020-09-21 NOTE — Progress Notes (Signed)
Supervised Weight Loss for Bariatric Surgery Appt start time: 1500 end time:  1530.  SWL Appointment 2 of 6   Anthropometrics Start Weight at NDES: 210.4lbs  (07/01/20) Weight: 215.0lbs Height: 5'3"  BMI: 38.09  Clinical Medications: reviewed list, patient reports no changes since previous visit. Health/ medical history changes: no changes per patient  Dietary/ Lifestyle Progress: Patient reports recent vacation with extra eating and less activity; she feels she has gained some fluid weight. Has resumed walking this morning. She reports feeling better physically since having to cancel her MNT appointment on 09/16/20; was diagnosed with virus.  Works until [employer], walks during break. Naps about 30 minutes after arriving home.    Dietary intake: Eating Pattern: 61mals and 1-2snacks daily Dining out:   Breakfast: 12pm -- pizza and some fried foods in the past week; usually cottage cheese or yogurt or egg whites  Snack: occasionally 1/2 banana  Lunch: Atkins or Slim fast protein shake Snack: 2-3 crackers with peanut butter; few baby carrots or small box raisins (45 calories) Dinner: salad with tuna and cheese Snack: egg whites with real bacon bits (7g) 25kcal Beverages: water 3-4 24oz containers daily; 1c black coffee 4:30am when waking up  Usual physical activity: women's gym 2x a week    Intervention:   Nutrition counseling for weight loss prior to upcoming bariatric surgery. Discussed eating a snack or light meal during the morning hours Discussed pre-workout snack options as well as post-workout to optimize energy and muscle recovery and prevent low blood sugar.                Nutritional Diagnosis:  [place]-3.3 Overweight/obesity related to history of excess calories and inadequate physical activity as evidenced by patient with current BMI of 38, following dietary guidelines for weight loss prior to bariatric surgery.               Teaching Method Utilized:   Visual Auditory Hands on   Learning Readiness:  Change in progress  Barriers to learning/adherence to lifestyle change: none  Demonstrated degree of understanding via:  Teach Back   Plan:  follow up 10/17/20 at 3:00pm

## 2020-10-19 ENCOUNTER — Encounter: Payer: Medicare Other | Attending: Surgery | Admitting: Dietician

## 2020-10-19 ENCOUNTER — Encounter: Payer: Self-pay | Admitting: Dietician

## 2020-10-19 ENCOUNTER — Other Ambulatory Visit: Payer: Self-pay

## 2020-10-19 VITALS — Wt 215.4 lb

## 2020-10-19 DIAGNOSIS — E669 Obesity, unspecified: Secondary | ICD-10-CM | POA: Diagnosis not present

## 2020-10-19 DIAGNOSIS — Z6838 Body mass index (BMI) 38.0-38.9, adult: Secondary | ICD-10-CM | POA: Diagnosis present

## 2020-10-19 NOTE — Progress Notes (Signed)
Supervised Weight Loss for Bariatric Surgery Appt start time: 1500 end time:  1530.  SWL Appointment 3 of 6   Anthropometrics Start Weight at NDES: 210.4lbs  (07/01/20) Weight: 215.4lbs Height: 5'3"  BMI: 38.16  Clinical Medications: reviewed list, patient reports no changes since previous visit Health/ medical history changes: no changes per patient  Dietary/ Lifestyle Progress: Patient reports doing well over the past month.  She has begun drinking a protein shake in am and eating 2 light meals.  Walking during lunch has increased, gym visits have decreased.    Dietary intake: Eating Pattern: 9meals and 1snacks daily Dining out: 0-1 per week; tropical smoothis cafe pesto chicken or smoothie  Breakfast: atkins or slim fast shake Snack: peanut butter crackers  Lunch: 12pm salad with [place], bacon bits, cheese, fat free dressing  Snack: none Dinner: 5pm salad and [place] sandwich Snack: none Beverages: 1c black coffee early am; 3-4 24oz containers daily  Usual physical activity: walking 35-40 minutes, 5 times per week (during lunch break); occasional gym exercise    Intervention:   Nutrition counseling for weight loss prior to upcoming bariatric surgery.               Nutritional Diagnosis:  [place]-3.3 Overweight/obesity related to history of excess calories and inadequate physical activity as evidenced by patient with current BMI of 38.16, following dietary guidelines for weight loss prior to bariatric surgery.               Teaching Method Utilized:  Animator provided: Visit summary with goals/ instructions  Learning Readiness:  Change in progress  Barriers to learning/adherence to lifestyle change: none  Demonstrated degree of understanding via:  Teach Back   Plan:   follow up 11/16/20 at 3:00pm .

## 2020-10-19 NOTE — Patient Instructions (Signed)
Continue with current healthy eating pattern.  Keep healthy snacks on hand to eat before or after a long gym workout.

## 2020-10-19 NOTE — Progress Notes (Signed)
[place]

## 2020-10-20 ENCOUNTER — Encounter: Payer: Self-pay | Admitting: Internal Medicine

## 2020-10-21 ENCOUNTER — Ambulatory Visit: Payer: Medicare Other | Admitting: Registered Nurse

## 2020-10-21 ENCOUNTER — Encounter
Admission: RE | Disposition: A | Discharge status: Home / Self Care | Payer: Self-pay | Source: Home / Self Care | Attending: Internal Medicine

## 2020-10-21 ENCOUNTER — Encounter: Payer: Self-pay | Admitting: Internal Medicine

## 2020-10-21 ENCOUNTER — Ambulatory Visit
Admission: RE | Admit: 2020-10-21 | Discharge: 2020-10-21 | Disposition: A | Discharge status: Home / Self Care | Payer: Medicare Other | Attending: Internal Medicine | Admitting: Internal Medicine

## 2020-10-21 DIAGNOSIS — Z791 Long term (current) use of non-steroidal anti-inflammatories (NSAID): Secondary | ICD-10-CM | POA: Insufficient documentation

## 2020-10-21 DIAGNOSIS — Z87892 Personal history of anaphylaxis: Secondary | ICD-10-CM | POA: Insufficient documentation

## 2020-10-21 DIAGNOSIS — Z1211 Encounter for screening for malignant neoplasm of colon: Secondary | ICD-10-CM | POA: Insufficient documentation

## 2020-10-21 DIAGNOSIS — Z79890 Hormone replacement therapy (postmenopausal): Secondary | ICD-10-CM | POA: Diagnosis not present

## 2020-10-21 DIAGNOSIS — Z881 Allergy status to other antibiotic agents status: Secondary | ICD-10-CM | POA: Insufficient documentation

## 2020-10-21 DIAGNOSIS — Z7951 Long term (current) use of inhaled steroids: Secondary | ICD-10-CM | POA: Insufficient documentation

## 2020-10-21 DIAGNOSIS — K640 First degree hemorrhoids: Secondary | ICD-10-CM | POA: Insufficient documentation

## 2020-10-21 DIAGNOSIS — Z888 Allergy status to other drugs, medicaments and biological substances status: Secondary | ICD-10-CM | POA: Insufficient documentation

## 2020-10-21 DIAGNOSIS — Z86010 Personal history of colonic polyps: Secondary | ICD-10-CM | POA: Insufficient documentation

## 2020-10-21 DIAGNOSIS — Z79899 Other long term (current) drug therapy: Secondary | ICD-10-CM | POA: Diagnosis not present

## 2020-10-21 HISTORY — PX: COLONOSCOPY WITH PROPOFOL: SHX5780

## 2020-10-21 HISTORY — DX: Prediabetes: R73.03

## 2020-10-21 SURGERY — COLONOSCOPY WITH PROPOFOL
Anesthesia: General

## 2020-10-21 MED ORDER — SODIUM CHLORIDE 0.9 % IV SOLN: INTRAVENOUS | Status: DC

## 2020-10-21 MED ORDER — PROPOFOL 10 MG/ML IV BOLUS
INTRAVENOUS | Status: AC
  Filled 2020-10-21: qty 20

## 2020-10-21 MED ORDER — LIDOCAINE HCL (CARDIAC) PF 100 MG/5ML IV SOSY
PREFILLED_SYRINGE | INTRAVENOUS | Status: DC | PRN
  Administered 2020-10-21: 100 mg via INTRAVENOUS

## 2020-10-21 MED ORDER — PROPOFOL 10 MG/ML IV BOLUS
INTRAVENOUS | Status: DC | PRN
  Administered 2020-10-21: 70 mg via INTRAVENOUS
  Administered 2020-10-21: 30 mg via INTRAVENOUS

## 2020-10-21 MED ORDER — PROPOFOL 500 MG/50ML IV EMUL
INTRAVENOUS | Status: DC | PRN
  Administered 2020-10-21: 125 ug/kg/min via INTRAVENOUS

## 2020-10-21 NOTE — H&P (Signed)
Outpatient short stay form Pre-procedure 10/21/2020 8:22 AM Marissa Quinn K. [name], [name]  Primary Physician: [name], [name]  Reason for visit:  Personal history of adenomatous colon polyps - 11/22/2017.  History of present illness:                            Patient presents for colonoscopy for a personal hx of colon polyps. The patient denies abdominal pain, abnormal weight loss or rectal bleeding.      Current Facility-Administered Medications:    0.9 %  sodium chloride infusion, , Intravenous, Continuous, Navah Grondin, Benay Pike, MD  Medications Prior to Admission  Medication Sig Dispense Refill Last Dose   atorvastatin (LIPITOR) 40 MG tablet Take 1 tablet by mouth daily.   10/20/2020   beclomethasone (QVAR) 40 MCG/ACT inhaler Inhale into the lungs.   10/20/2020   fluticasone (FLONASE) 50 MCG/ACT nasal spray Place into the nose.   10/21/2020   hydrochlorothiazide (HYDRODIURIL) 12.5 MG tablet Take 12.5 mg by mouth daily.   1 10/20/2020   levothyroxine (SYNTHROID) 88 MCG tablet Take by mouth.   10/20/2020   Multiple Vitamin (MULTI-VITAMINS) TABS Take 1 tablet by mouth as needed.    Past Week   albuterol (PROVENTIL HFA;VENTOLIN HFA) 108 (90 BASE) MCG/ACT inhaler Inhale 1 puff into the lungs every 4 (four) hours as needed.       ARIPiprazole (ABILIFY) 20 MG tablet Take 1 tablet (20 mg total) by mouth daily. 30 tablet 1    Ascorbic Acid (VITAMIN C ADULT GUMMIES PO) Take by mouth.   10/19/2020   aspirin EC 81 MG tablet Take by mouth.   10/18/2020   buPROPion (WELLBUTRIN SR) 150 MG 12 hr tablet Take 150 mg by mouth daily.   10/19/2020   Calcium Carbonate (CALCIUM 500 PO) Take by mouth.   10/18/2020   celecoxib (CELEBREX) 100 MG capsule TAKE 1 CAPSULE(100 MG) BY MOUTH TWICE DAILY   10/19/2020   Cholecalciferol 1000 units TBDP Take 1,000 Units by mouth daily.   10/18/2020   citalopram (CELEXA) 20 MG tablet Take 20 mg by mouth at bedtime.   10/19/2020   Cyanocobalamin (VITAMIN B-12 PO) Take by mouth.    10/18/2020   estradiol (ESTRACE) 0.1 MG/GM vaginal cream Insert fingertip amount nightly x 2 weeks, then every other night x 2 weeks, then 2-3 times weekly for maintenance      levocetirizine (XYZAL) 5 MG tablet Take 5 mg by mouth at bedtime.      spironolactone (ALDACTONE) 25 MG tablet Take 1 tablet by mouth daily.      UNABLE TO FIND Estriol vaginal cream Insert fingertip amount vaginally nightly for two weeks, every other night for two weeks, then twice a week thereafter      Vitamins-Lipotropics (MULTI-VITAMIN HP/MINERALS) CAPS Take 1 tablet by mouth daily.   10/18/2020     Allergies  Allergen Reactions   Haloperidol Other (See Comments)    Other reaction(s): Other (See Comments) Eyes fixated, facial movements off Eyes fixated, facial movements off   Risperidone Anaphylaxis   Demeclocycline     Other reaction(s): Other (See Comments) Allergy per patient--reaction unknown   Haloperidol Lactate     Eyes fixated, facial movements off   Lansoprazole     Other reaction(s): Unknown   Nitrofurantoin Nausea Only   Nitrofurantoin Monohyd Macro Nausea Only   Risperidone And Related    Tetracycline    Tetracyclines & Related     Other  reaction(s): Other (See Comments) Allergy per patient--reaction unknown     Past Medical History:  Diagnosis Date   Anemia    IDA   Anxiety    Arthritis    Asthma    Autism    Bilateral leg numbness    Bipolar disorder ([place])    Bronchitis    Depression    Difficult intubation    Environmental allergies    History of attempted suicide    Hypertension    Hypothyroidism    Obesity    morbid obesity 40.0-44.9   Pre-diabetes    Sleep apnea    Sleep apnea    Thyroid disease     Review of systems:  Otherwise negative.    Physical Exam  Gen: Alert, oriented. Appears stated age.  HEENT: [place]/AT. PERRLA. Lungs: CTA, no wheezes. CV: RR nl S1, S2. Abd: soft, benign, no masses. BS+ Ext: No edema. Pulses 2+    Planned procedures: Proceed  with colonoscopy. The patient understands the nature of the planned procedure, indications, risks, alternatives and potential complications including but not limited to bleeding, infection, perforation, damage to internal organs and possible oversedation/side effects from anesthesia. The patient agrees and gives consent to proceed.  Please refer to procedure notes for findings, recommendations and patient disposition/instructions.     Ameir Faria K. [name], [name] Gastroenterology 10/21/2020  8:22 AM

## 2020-10-21 NOTE — Anesthesia Postprocedure Evaluation (Signed)
Anesthesia Post Note  Patient: Marissa Quinn  Procedure(s) Performed: COLONOSCOPY WITH PROPOFOL  Patient location during evaluation: Endoscopy Anesthesia Type: General Level of consciousness: awake and alert and oriented Pain management: pain level controlled Vital Signs Assessment: post-procedure vital signs reviewed and stable Respiratory status: spontaneous breathing, nonlabored ventilation and respiratory function stable Cardiovascular status: blood pressure returned to baseline and stable Postop Assessment: no signs of nausea or vomiting Anesthetic complications: no   No notable events documented.   Last Vitals:  Vitals:   10/21/20 1029 10/21/20 1039  BP: 132/78 130/87  Pulse: 72 (!) 59  Resp: 17 18  Temp:    SpO2: 98% 98%    Last Pain:  Vitals:   10/21/20 1039  TempSrc:   PainSc: 0-No pain                 Charod Slawinski

## 2020-10-21 NOTE — Interval H&P Note (Signed)
History and Physical Interval Note:  10/21/2020 8:24 AM  Marissa Quinn  has presented today for surgery, with the diagnosis of PH POLYPS.  The various methods of treatment have been discussed with the patient and family. After consideration of risks, benefits and other options for treatment, the patient has consented to  Procedure(s) with comments: COLONOSCOPY WITH PROPOFOL (N/A) - EARLY AM D/T TRANSPORTATION as a surgical intervention.  The patient's history has been reviewed, patient examined, no change in status, stable for surgery.  I have reviewed the patient's chart and labs.  Questions were answered to the patient's satisfaction.     [place], [place]

## 2020-10-21 NOTE — Op Note (Signed)
[facility] Gastroenterology Patient Name: Marissa Quinn Procedure Date: 10/21/2020 7:25 AM MRN: [id number] Account #: [id number] Date of Birth: 1955-11-11 Admit Type: Outpatient Age: 65 Room: [facility] ENDO ROOM 2 Gender: Female Note Status: Finalized Instrument Name: Jasper Riling [id number] Procedure:             Colonoscopy Indications:           Surveillance: Personal history of adenomatous polyps                         on last colonoscopy 3 years ago Providers:             Lorie Apley K. [name] MD, MD Referring MD:          Ocie Cornfield. Ouida Sills MD, MD (Referring MD) Medicines:             Propofol per Anesthesia Complications:         No immediate complications. Procedure:             Pre-Anesthesia Assessment:                        - The risks and benefits of the procedure and the                         sedation options and risks were discussed with the                         patient. All questions were answered and informed                         consent was obtained.                        - Patient identification and proposed procedure were                         verified prior to the procedure by the nurse. The                         procedure was verified in the procedure room.                        - ASA Grade Assessment: III - A patient with severe                         systemic disease.                        - After reviewing the risks and benefits, the patient                         was deemed in satisfactory condition to undergo the                         procedure.                        After obtaining informed consent, the colonoscope was  passed under direct vision. Throughout the procedure,                         the patient's blood pressure, pulse, and oxygen                         saturations were monitored continuously. The                         Colonoscope was introduced through the anus and                          advanced to the the cecum, identified by appendiceal                         orifice and ileocecal valve. The colonoscopy was                         performed without difficulty. The patient tolerated                         the procedure well. The quality of the bowel                         preparation was good. The ileocecal valve, appendiceal                         orifice, and rectum were photographed. Findings:      The perianal and digital rectal examinations were normal. Pertinent       negatives include normal sphincter tone and no palpable rectal lesions.      Non-bleeding internal hemorrhoids were found during retroflexion. The       hemorrhoids were Grade I (internal hemorrhoids that do not prolapse).      The colon (entire examined portion) appeared normal. Impression:            - Non-bleeding internal hemorrhoids.                        - The entire examined colon is normal.                        - No specimens collected. Recommendation:        - Patient has a contact number available for                         emergencies. The signs and symptoms of potential                         delayed complications were discussed with the patient.                         Return to normal activities tomorrow. Written                         discharge instructions were provided to the patient.                        - Resume previous diet.                        -  Continue present medications.                        - Repeat colonoscopy in 10 years for screening                         purposes.                        - Return to GI office PRN.                        - The findings and recommendations were discussed with                         the patient. Procedure Code(s):     --- Professional ---                        F0263, Colorectal cancer screening; colonoscopy on                         individual at high risk Diagnosis Code(s):     --- Professional ---                         K64.0, First degree hemorrhoids                        Z86.010, Personal history of colonic polyps CPT copyright 2019 American Medical Association. All rights reserved. The codes documented in this report are preliminary and upon coder review may  be revised to meet current compliance requirements. Efrain Sella MD, MD 10/21/2020 10:17:38 AM This report has been signed electronically. Number of Addenda: 0 Note Initiated On: 10/21/2020 7:25 AM Scope Withdrawal Time: 0 hours 6 minutes 3 seconds  Total Procedure Duration: 0 hours 10 minutes 26 seconds  Estimated Blood Loss:  Estimated blood loss: none.      [facility]

## 2020-10-21 NOTE — Anesthesia Preprocedure Evaluation (Signed)
Anesthesia Evaluation  Patient identified by MRN, date of birth, ID band Patient awake    Reviewed: Allergy & Precautions, H&P , NPO status , Patient's Chart, lab work & pertinent test results, reviewed documented beta blocker date and time   Airway Mallampati: II   Neck ROM: full    Dental  (+) Poor Dentition   Pulmonary neg pulmonary ROS, asthma , sleep apnea and Continuous Positive Airway Pressure Ventilation ,    Pulmonary exam normal        Cardiovascular Exercise Tolerance: Poor hypertension, On Medications negative cardio ROS Normal cardiovascular exam Rhythm:regular Rate:Normal     Neuro/Psych PSYCHIATRIC DISORDERS Anxiety Depression Bipolar Disorder  Neuromuscular disease negative neurological ROS  negative psych ROS   GI/Hepatic negative GI ROS, Neg liver ROS,   Endo/Other  negative endocrine ROSHypothyroidism   Renal/GU negative Renal ROS  negative genitourinary   Musculoskeletal   Abdominal   Peds  Hematology negative hematology ROS (+) Blood dyscrasia, anemia ,   Anesthesia Other Findings Past Medical History: No date: Anemia     Comment:  IDA No date: Anxiety No date: Arthritis No date: Asthma No date: Autism No date: Bilateral leg numbness No date: Bipolar disorder ([place]) No date: Bronchitis No date: Depression No date: Difficult intubation No date: Environmental allergies No date: History of attempted suicide No date: Hypertension No date: Hypothyroidism No date: Obesity     Comment:  morbid obesity 40.0-44.9 No date: Pre-diabetes No date: Sleep apnea No date: Sleep apnea No date: Thyroid disease Past Surgical History: No date: ABDOMINAL HYSTERECTOMY No date: CARPAL TUNNEL RELEASE No date: CHOLECYSTECTOMY No date: COLONOSCOPY 11/22/2017: COLONOSCOPY WITH PROPOFOL; N/A     Comment:  Procedure: COLONOSCOPY WITH PROPOFOL;  Surgeon: [place],               Benay Pike, MD;  Location: [facility]  ENDOSCOPY;  Service:               Gastroenterology;  Laterality: N/A; 05/04/2015: CYSTOSCOPY     Comment:  Procedure: CYSTOSCOPY;  Surgeon: Benjaman Kindler, MD;                Location: [facility] ORS;  Service: Gynecology;; No date: GALLBLADDER SURGERY No date: NECK SURGERY No date: OOPHORECTOMY 03/30/2015: ROBOTIC ASSISTED LAPAROSCOPIC HYSTERECTOMY AND  SALPINGECTOMY; Bilateral     Comment:  Procedure: ROBOTIC ASSISTED LAPAROSCOPIC HYSTERECTOMY               AND SALPINGECTOMY;  Surgeon: Benjaman Kindler, MD;                Location: [facility] ORS;  Service: Gynecology;  Laterality:               Bilateral; 05/04/2015: ROBOTIC ASSISTED TOTAL HYSTERECTOMY WITH SALPINGECTOMY;  Bilateral     Comment:  Procedure: ROBOTIC ASSISTED TOTAL HYSTERECTOMY WITH               BILATERAL SALPINGOOPHERECTOMY;  Surgeon: Benjaman Kindler,              MD;  Location: [facility] ORS;  Service: Gynecology;                Laterality: Bilateral; No date: THYROID SURGERY No date: THYROIDECTOMY     Comment:  total   Reproductive/Obstetrics negative OB ROS                             Anesthesia Physical Anesthesia Plan  ASA: 3  Anesthesia Plan: General   Post-op Pain Management:    Induction:   PONV Risk Score and Plan:   Airway Management Planned:   Additional Equipment:   Intra-op Plan:   Post-operative Plan:   Informed Consent: I have reviewed the patients History and Physical, chart, labs and discussed the procedure including the risks, benefits and alternatives for the proposed anesthesia with the patient or authorized representative who has indicated his/her understanding and acceptance.     Dental Advisory Given  Plan Discussed with: CRNA  Anesthesia Plan Comments:         Anesthesia Quick Evaluation

## 2020-10-21 NOTE — Transfer of Care (Signed)
Immediate Anesthesia Transfer of Care Note  Patient: Marissa Quinn  Procedure(s) Performed: COLONOSCOPY WITH PROPOFOL  Patient Location: PACU  Anesthesia Type:MAC and General  Level of Consciousness: drowsy  Airway & Oxygen Therapy: Patient Spontanous Breathing and Patient connected to nasal cannula oxygen  Post-op Assessment: Report given to RN and Post -op Vital signs reviewed and stable  Post vital signs: Reviewed and stable  Last Vitals:  Vitals Value Taken Time  BP    Temp    Pulse 62 10/21/20 1019  Resp 15 10/21/20 1019  SpO2 100 % 10/21/20 1019  Vitals shown include unvalidated device data.  Last Pain:  Vitals:   10/21/20 0753  TempSrc: Temporal  PainSc: 0-No pain         Complications: No notable events documented.

## 2020-10-22 ENCOUNTER — Encounter: Payer: Self-pay | Admitting: Internal Medicine

## 2020-11-16 ENCOUNTER — Other Ambulatory Visit: Payer: Self-pay

## 2020-11-16 ENCOUNTER — Encounter: Payer: Self-pay | Admitting: Dietician

## 2020-11-16 ENCOUNTER — Encounter: Payer: Medicare Other | Attending: Surgery | Admitting: Dietician

## 2020-11-16 VITALS — Ht 63.0 in | Wt 216.3 lb

## 2020-11-16 DIAGNOSIS — E669 Obesity, unspecified: Secondary | ICD-10-CM | POA: Diagnosis not present

## 2020-11-16 DIAGNOSIS — Z6838 Body mass index (BMI) 38.0-38.9, adult: Secondary | ICD-10-CM | POA: Diagnosis present

## 2020-11-16 NOTE — Patient Instructions (Signed)
Plan to include a vegetable or fruit or both with meals; ok to drink a low sodium V8 for one vegetable serving daily.  Start working on Radiographer, therapeutic and other fluids during meals; this will be important after surgery.  Keep up regular exercise, and eating at least 3 times a day; great job!

## 2020-11-16 NOTE — Progress Notes (Signed)
Supervised Weight Loss for Bariatric Surgery Appt start time: 1500 end time:  1530.  SWL Appointment 4 of 6   Anthropometrics Start Weight at [facility]: 210.4lbs  (07/01/20) Weight: 216.3lbs Height: 5'3"  BMI: 38.32  Clinical Medications: reviewed list in medical record, patient reports no changes since previous visit. Health/ medical history changes: no changes per patient  Dietary/ Lifestyle Progress: Patient reports consuming more variety of foods, cooking more meals at home. Cooks at least once a week and eats leftovers several days.  Reports some hiking in recent weeks.  She states she ate lunch before appointment today, feels this might explain some weight increase.   Dietary intake: Eating Pattern: 3 meals and 0 snacks daily Dining out:   Breakfast: breakfast or protein shake  Snack: none  Lunch: yogurt or cottage cheese; occasionally salad Snack: none Dinner: 6:30-7pm-- chicken pie (veg in pie); homemade chili with beans with crackers Snack: none Beverages: 1c black coffee in am; water 24oz 3-4 times daily  Usual physical activity: walking, hiking 6+ minutes, 30-40 minutes 5x a week,     Intervention:   Nutrition counseling for weight loss prior to upcoming bariatric surgery. Discussed goal of gradual reduction in starchy foods prior to surgery, but importance of including low carb vegetables multiple times daily; discussed convenient options. Encouraged gradual reduction in fluid intake during meals and chewing foods thoroughly.                Nutritional Diagnosis:  [place]-3.3 Overweight/obesity related to history of excess calories and inadequate physical activity as evidenced by patient with current BMI of 38.32, following dietary guidelines for weight loss prior to bariatric surgery.               Teaching Method Utilized:  Ship broker   Materials provided: Post-op bariatric diet stage template Pre-op Goals  Visit summary with goals/ instructions  Learning  Readiness:  Change in progress  Barriers to learning/adherence to lifestyle change: none  Demonstrated degree of understanding via:  Teach Back   Plan:   12/23/20 at 2:15pm .

## 2020-12-14 ENCOUNTER — Ambulatory Visit: Payer: Medicare Other | Admitting: Dietician

## 2020-12-23 ENCOUNTER — Encounter: Payer: Self-pay | Admitting: Dietician

## 2020-12-23 ENCOUNTER — Other Ambulatory Visit: Payer: Self-pay

## 2020-12-23 ENCOUNTER — Encounter: Payer: Medicare Other | Attending: Surgery | Admitting: Dietician

## 2020-12-23 VITALS — Wt 219.1 lb

## 2020-12-23 DIAGNOSIS — Z6838 Body mass index (BMI) 38.0-38.9, adult: Secondary | ICD-10-CM | POA: Diagnosis present

## 2020-12-23 DIAGNOSIS — E66812 Obesity, class 2: Secondary | ICD-10-CM

## 2020-12-23 DIAGNOSIS — E669 Obesity, unspecified: Secondary | ICD-10-CM | POA: Insufficient documentation

## 2020-12-23 NOTE — Progress Notes (Signed)
Supervised Weight Loss for Bariatric Surgery Appt start time: 1121 end time:  6244  SWL Appointment 5 of 6   Anthropometrics Start Weight at [facility]: 210.4lbs  (07/01/20) Weight: 219.1lbs    Height: 5'3"  BMI: 38.81  Clinical Medications: no changes per patient Health/ medical history changes: no changes per patient  Dietary/ Lifestyle Progress: Patient reports increased eating over Thanksgiving holiday.  Spent week with daughter and was not walking regularly. Has now resumed physical activity  Dietary intake: Eating Pattern: 2 meals and 2-3 snacks daily Dining out:   Breakfast: breakfast or protein shake  Snack:  none Lunch: yogurt or cottage cheese; salad Snack: oatmeal recently Dinner: salad with chicken and bacon bits, cucumber, celery,carrots;  Snack: fruit Beverages: water 72oz+ daily; 1c coffee in am  Usual physical activity: walking/ hiking 30-40 minutes, 3-5 times a week    Intervention:   Nutrition counseling for weight loss prior to upcoming bariatric surgery. Reviewed post-op need to avoid fluids during meals.               Nutritional Diagnosis:  [place]-3.3 Overweight/obesity related to history of excess calories and inadequate physical activity as evidenced by patient with current BMI of 38.8, following dietary guidelines for weight loss prior to bariatric surgery.               Teaching Method Utilized:  Visual Auditory Hands on  Materials provided: Visit summary with goals/ instructions  Learning Readiness:  Change in progress  Barriers to learning/adherence to lifestyle change: none  Demonstrated degree of understanding via:  Teach Back   Plan:   01/07/21 at 4:00pm .

## 2020-12-23 NOTE — Patient Instructions (Signed)
Continue to eat a meal or snack every 3-4 hours each day, great job! Keep up regular exercise, 3-4 times a week or more.  Avoid fluids/ water during meals. Stop drinking 15 minutes before eating, then wait another 30 minutes after eating to drink more water. Be choosy about what you eat for treats and snacks over the holiday season. Eat small portions of your very favorites, leave other things alone.

## 2021-01-07 ENCOUNTER — Other Ambulatory Visit: Payer: Self-pay

## 2021-01-07 ENCOUNTER — Encounter: Payer: Self-pay | Admitting: Dietician

## 2021-01-07 ENCOUNTER — Encounter: Payer: Medicare Other | Attending: Surgery | Admitting: Dietician

## 2021-01-07 VITALS — Ht 63.0 in | Wt 219.6 lb

## 2021-01-07 DIAGNOSIS — E66812 Obesity, class 2: Secondary | ICD-10-CM

## 2021-01-07 DIAGNOSIS — E669 Obesity, unspecified: Secondary | ICD-10-CM | POA: Diagnosis not present

## 2021-01-07 DIAGNOSIS — Z6838 Body mass index (BMI) 38.0-38.9, adult: Secondary | ICD-10-CM | POA: Diagnosis present

## 2021-01-07 NOTE — Patient Instructions (Signed)
Check into other options or flavors of protein shakes to avoid getting "burned out" on any one flavor. 2 weeks of the pre-op diet require 1 protein shake as a meal daily, + 2 weeks after surgery of only protein shakes can lead to burn-out.  Consider gradually reducing the caffeine in coffee - try 1/2-caff pods [facility] brand, other brands also) Keep up the exercise, great job!

## 2021-01-07 NOTE — Progress Notes (Signed)
Supervised Weight Loss for Bariatric Surgery Appt start time: 1600 end time:  1630.  SWL Appointment 6 of 6   Anthropometrics Start Weight at [facility]: 210.4lbs  (07/01/20) Weight: 219.6lbs Height: 5'3"  BMI: 38.9  Clinical Medications: no changes since previous visit Health/ medical history changes: no changes   Dietary/ Lifestyle Progress: Patient reports resuming regular exercise in recent weeks.  Weight has been stable since previous visit.   Dietary intake: Eating Pattern: 37meals and 2-3 snacks daily Dining out: 0-1 meal weekly  Breakfast: breakfast drink or protein shake  Snack: apple or banana  Lunch: salads with chicken, bacon bits, cheese, egg  Snack: occasionally oatmeal or sandwich with [place] and cheese Dinner: salad (same as pm) Snack: sandwich with cheese and [place] (if not in pm) Beverages: 72oz or more water; 1c coffee in am  Usual physical activity: walking 1.5 miles during break at work 4x a week    Intervention:   Nutrition counseling for weight loss prior to upcoming bariatric surgery. Reviewed pre-op diet guidelines Instructed on need for bariatric formula multivitamins after surgery.  Discussed options for protein shakes, and controlling use of shakes to avoid "burnout" prior to or after surgery. Patient voices understanding of pre and post-op diet guidelines, and is motivated to follow the bariatric diet closely. She has support from family and friends for her weight loss journey.  From a nutrition standpoint, she remains a good candidate for surgery.               Nutritional Diagnosis:  [place]-3.3 Overweight/obesity related to history of excess calories and inadequate physical activity as evidenced by patient with current BMI of 38.9, following dietary guidelines for weight loss prior to bariatric surgery.               Teaching Method Utilized:  Animator provided: Visit summary with goals/ instructions  Learning Readiness:   Change in progress  Barriers to learning/adherence to lifestyle change: none  Demonstrated degree of understanding via:  Teach Back   Plan:   return for pre-op class at least 2 weeks before surgery .

## 2021-02-18 ENCOUNTER — Other Ambulatory Visit: Payer: Self-pay | Admitting: Internal Medicine

## 2021-02-18 DIAGNOSIS — Z1231 Encounter for screening mammogram for malignant neoplasm of breast: Secondary | ICD-10-CM

## 2021-03-10 ENCOUNTER — Emergency Department
Admission: EM | Admit: 2021-03-10 | Discharge: 2021-03-11 | Disposition: A | Discharge status: Home / Self Care | Payer: Medicare Other | Attending: Emergency Medicine | Admitting: Emergency Medicine

## 2021-03-10 ENCOUNTER — Emergency Department: Payer: Medicare Other

## 2021-03-10 ENCOUNTER — Encounter: Payer: Self-pay | Admitting: Emergency Medicine

## 2021-03-10 ENCOUNTER — Other Ambulatory Visit: Payer: Self-pay

## 2021-03-10 DIAGNOSIS — X58XXXA Exposure to other specified factors, initial encounter: Secondary | ICD-10-CM | POA: Insufficient documentation

## 2021-03-10 DIAGNOSIS — T148XXA Other injury of unspecified body region, initial encounter: Secondary | ICD-10-CM

## 2021-03-10 DIAGNOSIS — Z79899 Other long term (current) drug therapy: Secondary | ICD-10-CM | POA: Diagnosis not present

## 2021-03-10 DIAGNOSIS — I10 Essential (primary) hypertension: Secondary | ICD-10-CM | POA: Diagnosis not present

## 2021-03-10 DIAGNOSIS — M7989 Other specified soft tissue disorders: Secondary | ICD-10-CM | POA: Diagnosis not present

## 2021-03-10 DIAGNOSIS — J45909 Unspecified asthma, uncomplicated: Secondary | ICD-10-CM | POA: Insufficient documentation

## 2021-03-10 DIAGNOSIS — R42 Dizziness and giddiness: Secondary | ICD-10-CM | POA: Diagnosis not present

## 2021-03-10 DIAGNOSIS — E039 Hypothyroidism, unspecified: Secondary | ICD-10-CM | POA: Insufficient documentation

## 2021-03-10 DIAGNOSIS — Z7982 Long term (current) use of aspirin: Secondary | ICD-10-CM | POA: Insufficient documentation

## 2021-03-10 DIAGNOSIS — S8992XA Unspecified injury of left lower leg, initial encounter: Secondary | ICD-10-CM | POA: Diagnosis present

## 2021-03-10 DIAGNOSIS — F840 Autistic disorder: Secondary | ICD-10-CM | POA: Insufficient documentation

## 2021-03-10 DIAGNOSIS — S8002XA Contusion of left knee, initial encounter: Secondary | ICD-10-CM | POA: Insufficient documentation

## 2021-03-10 NOTE — ED Provider Notes (Signed)
[facility] Provider Note    Event Date/Time   First MD Initiated Contact with Patient 03/10/21 2334     (approximate)   History   Bruise   HPI  Marissa Quinn is a 66 y.o. female who presents to the ED from home with a chief complaint of bruise to left knee.  Denies known injury.  States she got up, felt something in her leg and then noticed the bruise.  Takes a baby aspirin daily.  Over the past 2 weeks patient has been feeling lightheaded/dizzy mainly in the mornings upon awakening.  Denies headache, slurred speech, facial droop, extremity weakness/numbness/tingling.  Denies fever, cough, chest pain, shortness of breath, abdominal pain, nausea or vomiting.     Past Medical History   Past Medical History:  Diagnosis Date   Anemia    IDA   Anxiety    Arthritis    Asthma    Autism    Bilateral leg numbness    Bipolar disorder ([place])    Bronchitis    Depression    Difficult intubation    Environmental allergies    History of attempted suicide    Hypertension    Hypothyroidism    Obesity    morbid obesity 40.0-44.9   Pre-diabetes    Sleep apnea    Sleep apnea    Thyroid disease      Active Problem List   Patient Active Problem List   Diagnosis Date Noted   History of attempted suicide 06/02/2016   Aortic atherosclerosis ([place]) 06/01/2016   Severe manic bipolar I disorder ([place]) 05/19/2016   Bipolar affective disorder, manic, severe, with psychotic behavior ([place]) 03/29/2016   Other allergic rhinitis 08/04/2015   Postmenopausal bleeding 05/04/2015   Thickened endometrium 04/09/2015   Body mass index (BMI) of 45.0-49.9 in adult ([place]) 03/04/2015   Major depression, single episode, in complete remission ([place]) 03/04/2015   Disorder of patellofemoral joint 03/04/2015   Bipolar 1 disorder ([place]) 03/04/2015   Patellofemoral stress syndrome of right knee 03/04/2015   Hemorrhage, postmenopausal 01/09/2015    Autism spectrum 06/30/2014   Clinical depression 06/30/2014   Anxiety, generalized 06/30/2014   Insomnia, persistent 06/30/2014   Depression, major, recurrent, moderate ([place]) 06/30/2014   Neurosis, 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/30/2014   Depression, major, recurrent, in complete remission ([place]) 06/30/2014   Attempted suicide ([place]) 06/30/2014   Obstructive apnea 06/05/2014   H/O: HTN (hypertension) 05/16/2014   H/O: hypothyroidism 05/16/2014   H/O: obesity 05/16/2014   Apnea, sleep 05/16/2014   History of neurodevelopmental disorder 05/16/2014   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   Essential (primary) hypertension 02/26/2014   Leg numbness 11/21/2013   Extreme obesity 11/21/2013   Mechanical and motor problems with internal organs 11/21/2013   Other general symptoms and signs 11/21/2013   Morbid obesity ([place]) 11/21/2013   Spells 11/21/2013   Adult hypothyroidism 07/18/2013   Anxiety and depression 05/22/2012     Past Surgical History   Past Surgical History:  Procedure Laterality Date   ABDOMINAL HYSTERECTOMY     CARPAL TUNNEL RELEASE     CHOLECYSTECTOMY     COLONOSCOPY     COLONOSCOPY WITH PROPOFOL N/A 11/22/2017   Procedure: COLONOSCOPY WITH PROPOFOL;  Surgeon: Toledo, Benay Pike, MD;  Location: [facility] ENDOSCOPY;  Service: Gastroenterology;  Laterality: N/A;   COLONOSCOPY WITH PROPOFOL N/A 10/21/2020   Procedure: COLONOSCOPY WITH PROPOFOL;  Surgeon: Toledo, Benay Pike, MD;  Location: [facility] ENDOSCOPY;  Service: Gastroenterology;  Laterality: N/A;  EARLY  AM D/T TRANSPORTATION   CYSTOSCOPY  05/04/2015   Procedure: CYSTOSCOPY;  Surgeon: Benjaman Kindler, MD;  Location: [facility] ORS;  Service: Gynecology;;   GALLBLADDER SURGERY     NECK SURGERY     OOPHORECTOMY     ROBOTIC ASSISTED LAPAROSCOPIC HYSTERECTOMY AND SALPINGECTOMY Bilateral 03/30/2015   Procedure: ROBOTIC ASSISTED LAPAROSCOPIC HYSTERECTOMY AND SALPINGECTOMY;  Surgeon: Benjaman Kindler, MD;  Location:  [facility] ORS;  Service: Gynecology;  Laterality: Bilateral;   ROBOTIC ASSISTED TOTAL HYSTERECTOMY WITH SALPINGECTOMY Bilateral 05/04/2015   Procedure: ROBOTIC ASSISTED TOTAL HYSTERECTOMY WITH BILATERAL SALPINGOOPHERECTOMY;  Surgeon: Benjaman Kindler, MD;  Location: [facility] ORS;  Service: Gynecology;  Laterality: Bilateral;   THYROID SURGERY     THYROIDECTOMY     total     Home Medications   Prior to Admission medications   Medication Sig Start Date End Date Taking? Authorizing Provider  albuterol (PROVENTIL HFA;VENTOLIN HFA) 108 (90 BASE) MCG/ACT inhaler Inhale 1 puff into the lungs every 4 (four) hours as needed.     [provider]  ARIPiprazole (ABILIFY) 20 MG tablet Take 1 tablet (20 mg total) by mouth daily. 06/02/16 11/22/17  Elvin So, MD  Ascorbic Acid (VITAMIN C ADULT GUMMIES PO) Take by mouth.    [provider]  aspirin EC 81 MG tablet Take by mouth.    [provider]  atorvastatin (LIPITOR) 40 MG tablet Take 1 tablet by mouth daily. 03/20/20   [provider]  buPROPion (WELLBUTRIN SR) 150 MG 12 hr tablet Take 150 mg by mouth daily.    [provider]  Calcium Carbonate (CALCIUM 500 PO) Take by mouth.    [provider]  celecoxib (CELEBREX) 100 MG capsule TAKE 1 CAPSULE(100 MG) BY MOUTH TWICE DAILY 03/20/20   [provider]  Cholecalciferol 1000 units TBDP Take 1,000 Units by mouth daily.    [provider]  citalopram (CELEXA) 20 MG tablet Take 20 mg by mouth at bedtime. 05/20/20   [provider]  Cyanocobalamin (VITAMIN B-12 PO) Take by mouth.    [provider]  estradiol (ESTRACE) 0.1 MG/GM vaginal cream Insert fingertip amount nightly x 2 weeks, then every other night x 2 weeks, then 2-3 times weekly for maintenance 10/18/19   [provider]  fluticasone (FLONASE) 50 MCG/ACT nasal spray Place into the nose. 04/01/14   [provider]  hydrochlorothiazide (HYDRODIURIL)  12.5 MG tablet Take 12.5 mg by mouth daily.  05/25/15   [provider]  levocetirizine (XYZAL) 5 MG tablet Take 5 mg by mouth at bedtime. 03/21/16   [provider]  levothyroxine (SYNTHROID) 88 MCG tablet Take by mouth. 03/23/20   [provider]  Multiple Vitamin (MULTI-VITAMINS) TABS Take 1 tablet by mouth as needed.     [provider]  spironolactone (ALDACTONE) 25 MG tablet Take 1 tablet by mouth daily. 09/16/19 09/15/20  [provider]  UNABLE TO FIND Estriol vaginal cream Insert fingertip amount vaginally nightly for two weeks, every other night for two weeks, then twice a week thereafter 10/18/19   [provider]  Vitamins-Lipotropics (MULTI-VITAMIN HP/MINERALS) CAPS Take 1 tablet by mouth daily.    [provider]     Allergies  Haloperidol, Risperidone, Demeclocycline, Haloperidol lactate, Lansoprazole, Nitrofurantoin, Nitrofurantoin monohyd macro, Risperidone and related, Tetracycline, and Tetracyclines & related   Family History   Family History  Problem Relation Age of Onset   Brain cancer Mother    Hypertension Brother    Depression Paternal Grandmother  Dementia Father    Alcohol abuse Father    Depression Father    Breast cancer Sister 63       half sister paternal side   Depression Daughter      Physical Exam  Triage Vital Signs: ED Triage Vitals  Enc Vitals Group     BP 03/10/21 2312 (!) 180/118     Pulse Rate 03/10/21 2312 72     Resp 03/10/21 2312 16     Temp 03/10/21 2312 98.4 F (36.9 C)     Temp Source 03/10/21 2312 Oral     SpO2 03/10/21 2312 99 %     Weight 03/10/21 2313 218 lb (98.9 kg)     Height 03/10/21 2313 5\' 3"  (1.6 m)     Head Circumference --      Peak Flow --      Pain Score 03/10/21 2313 0     Pain Loc --      Pain Edu? --      Excl. in [place]? --     Updated Vital Signs: BP (!) 138/92    Pulse 66    Temp 98.4 F (36.9 C) (Oral)    Resp 18    Ht 5\' 3"  (1.6 m)     Wt 98.9 kg    LMP 03/24/2011    SpO2 98%    BMI 38.62 kg/m    General: Awake, no distress.  CV:  RRR.  Good peripheral perfusion.  Resp:  Normal effort.  CTA B. Abd:  Nontender.  No distention.  Other:  Palm-sized area of bruising to medial left knee.  Varicose veins and spider angiomata is noted.  Full range of motion without pain.  No calf tenderness or swelling.  2+ distal pulses.  Brisk, less than 5-second capillary refill.   ED Results / Procedures / Treatments  Labs (all labs ordered are listed, but only abnormal results are displayed) Labs Reviewed  CBC - Abnormal; Notable for the following components:      Result Value   RBC 3.85 (*)    All other components within normal limits  PROTIME-INR  BASIC METABOLIC PANEL  CK  TROPONIN I (HIGH SENSITIVITY)     EKG  ED ECG REPORT I, Teresia Myint J, the attending physician, personally viewed and interpreted this ECG.   Date: 03/11/2021  EKG Time: 0124  Rate: 60  Rhythm: normal sinus rhythm  Axis: Normal  Intervals:none  ST&T Change: Nonspecific    RADIOLOGY I have independently visualized and interpreted patient's DVT ultrasound, left knee x-ray as well as the radiology interpretation:  DVT ultrasound: No DVT  Left knee x-ray: Unremarkable  Official radiology report(s): US Venous Img Lower Unilateral Left  Result Date: 03/11/2021 CLINICAL DATA:  Left leg pain x1 day EXAM: LEFT LOWER EXTREMITY VENOUS DOPPLER ULTRASOUND TECHNIQUE: Gray-scale sonography with compression, as well as color and duplex ultrasound, were performed to evaluate the deep venous system(s) from the level of the common femoral vein through the popliteal and proximal calf veins. COMPARISON:  None. FINDINGS: VENOUS Normal compressibility of the common femoral, superficial femoral, and popliteal veins, as well as the visualized calf veins. Visualized portions of profunda femoral vein and great saphenous vein unremarkable. No filling defects to suggest DVT  on grayscale or color Doppler imaging. Doppler waveforms show normal direction of venous flow, normal respiratory plasticity and response to augmentation. Limited views of the contralateral common femoral vein are unremarkable. OTHER None. Limitations: none IMPRESSION: Negative. Electronically Signed  By: Julian Hy [name]   On: 03/11/2021 00:18   DG Knee Complete 4 Views Left  Result Date: 03/11/2021 CLINICAL DATA:  Bruising. EXAM: LEFT KNEE - COMPLETE 4+ VIEW COMPARISON:  None. FINDINGS: There is no acute fracture or dislocation. Tiny bone fragment along the intercondylar notch, likely chronic. Mild arthritic changes and narrowing of the patellofemoral compartment. No significant joint effusion. The soft tissues are unremarkable. IMPRESSION: 1. No acute fracture or dislocation. 2. Mild arthritic changes. Electronically Signed   By: Anner Crete [name]   On: 03/11/2021 01:07     PROCEDURES:  Critical Care performed: No  Procedures   MEDICATIONS ORDERED IN ED: Medications - No data to display   IMPRESSION / MDM / [place] / ED COURSE  I reviewed the triage vital signs and the nursing notes.                             66 year old female who presents for bruising of unknown origin.  Differential diagnosis includes but is not limited to musculoskeletal contusion, thrombocytopenia, leukemia, coagulation deficiency, etc.  DVT ultrasound is negative.  Will obtain basic lab work, plain film x-rays and reassess.  I have personally reviewed patient's records and states that she had a recent PCP office visit on 03/09/2021 for epidermal cyst and PCP office visit on 02/25/2021 for dizziness which PCP did blood work and symptoms thought likely to be secondary to Wellbutrin side effects.  Clinical Course as of 03/11/21 0158  Thu Mar 11, 2021  0157 Updated patient on all lab results.  WBC 6.3, platelets 243, normal electrolytes, normal CK, INR 1, negative troponin, unremarkable x-rays.   Will refer to hematology for further work-up of bruising.  Strict return precautions given.  Patient verbalizes understanding and agrees with plan of care. [JS]    Clinical Course User Index [JS] Paulette Blanch, MD     FINAL CLINICAL IMPRESSION(S) / ED DIAGNOSES   Final diagnoses:  Bruise     Rx / DC Orders   ED Discharge Orders     None        Note:  This document was prepared using Dragon voice recognition software and may include unintentional dictation errors.   Paulette Blanch, MD 03/11/21 [phone]

## 2021-03-10 NOTE — ED Triage Notes (Signed)
Pt to ED from home c/o bruise behind left knee she noticed last night, denies injury or hitting it.  States got up and felt something in leg and then noticed bruise.  States bruise has gone down in size since yesterday, painful, denies SOB.  Takes 81 ASA daily.  Pt A&Ox4, chest rise even and unlabored, skin WNL and in NAD at this time.

## 2021-03-10 NOTE — ED Notes (Signed)
Pt to US.

## 2021-03-11 ENCOUNTER — Emergency Department: Payer: Medicare Other

## 2021-03-11 LAB — CK: Total CK: 155 U/L (ref 38–234)

## 2021-03-11 LAB — BASIC METABOLIC PANEL
Anion gap: 5 (ref 5–15)
BUN: 20 mg/dL (ref 8–23)
CO2: 27 mmol/L (ref 22–32)
Calcium: 9.1 mg/dL (ref 8.9–10.3)
Chloride: 107 mmol/L (ref 98–111)
Creatinine, Ser: 0.97 mg/dL (ref 0.44–1.00)
GFR, Estimated: >60 mL/min (ref >60)
Glucose, Bld: 98 mg/dL (ref 70–99)
Potassium: 4 mmol/L (ref 3.5–5.1)
Sodium: 139 mmol/L (ref 135–145)

## 2021-03-11 LAB — CBC
HCT: 37 % (ref 36.0–46.0)
Hemoglobin: 12.1 g/dL (ref 12.0–15.0)
MCH: 31.4 pg (ref 26.0–34.0)
MCHC: 32.7 g/dL (ref 30.0–36.0)
MCV: 96.1 fL (ref 80.0–100.0)
Platelets: 243 10*3/uL (ref 150–400)
RBC: 3.85 MIL/uL — ABNORMAL LOW (ref 3.87–5.11)
RDW: 13.2 % (ref 11.5–15.5)
WBC: 6.3 10*3/uL (ref 4.0–10.5)
nRBC: 0 % (ref 0.0–0.2)

## 2021-03-11 LAB — TROPONIN I (HIGH SENSITIVITY): Troponin I (High Sensitivity): 7 ng/L (ref <18)

## 2021-03-11 LAB — PROTIME-INR
INR: 1 (ref 0.8–1.2)
Prothrombin Time: 12.8 seconds (ref 11.4–15.2)

## 2021-03-11 NOTE — Discharge Instructions (Addendum)
Elevate affected area, apply ice over Ace wrap to reduce bruising.  Return to the ER for worsening symptoms, persistent vomiting, difficulty breathing or other concerns.

## 2021-03-11 NOTE — ED Notes (Signed)
Pt back from US
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
## 2021-03-17 ENCOUNTER — Inpatient Hospital Stay: Payer: Medicare Other | Attending: Internal Medicine | Admitting: Internal Medicine

## 2021-03-17 ENCOUNTER — Other Ambulatory Visit: Payer: Self-pay

## 2021-03-17 ENCOUNTER — Encounter: Payer: Self-pay | Admitting: Internal Medicine

## 2021-03-17 ENCOUNTER — Inpatient Hospital Stay: Payer: Medicare Other

## 2021-03-17 DIAGNOSIS — Z79899 Other long term (current) drug therapy: Secondary | ICD-10-CM | POA: Insufficient documentation

## 2021-03-17 DIAGNOSIS — D649 Anemia, unspecified: Secondary | ICD-10-CM | POA: Diagnosis present

## 2021-03-17 DIAGNOSIS — M25562 Pain in left knee: Secondary | ICD-10-CM | POA: Insufficient documentation

## 2021-03-17 DIAGNOSIS — R233 Spontaneous ecchymoses: Secondary | ICD-10-CM | POA: Insufficient documentation

## 2021-03-17 DIAGNOSIS — M25561 Pain in right knee: Secondary | ICD-10-CM | POA: Insufficient documentation

## 2021-03-17 DIAGNOSIS — Z791 Long term (current) use of non-steroidal anti-inflammatories (NSAID): Secondary | ICD-10-CM | POA: Diagnosis not present

## 2021-03-17 LAB — PROTIME-INR
INR: 1 (ref 0.8–1.2)
Prothrombin Time: 13.5 seconds (ref 11.4–15.2)

## 2021-03-17 LAB — APTT: aPTT: 29 seconds (ref 24–36)

## 2021-03-17 NOTE — Assessment & Plan Note (Addendum)
#   Bruise on Left medial knee- one episode/spontaneous-  Do not suspect any congenital or familial cause of bleeding diathesis given absence of patient's previous bleeding history. FEB 2023-repeat PT PTT normal.  Suspect Celebrex as a possibility for bruising.  Recommend limiting Celebrex/see below  # Bil Knee pain- on celebrex 100 BID [2-3 years; PCP]; recommend follow-up with Dr. Ouida Sills regarding cutting down the frequency of Celebrex to once a day; and take Tylenol as needed.   Thank you Dr.Sung for allowing me to participate in the care of your pleasant patient. Please do not hesitate to contact me with questions or concerns in the interim.  # DISPOSITION: # labs today-  PT/PTT # follow up TBD- Dr.B  Cc; Dr.Anderson

## 2021-03-17 NOTE — Progress Notes (Signed)
[place] NOTE  Patient Care Team: Kirk Ruths, MD as PCP - General (Internal Medicine)  CHIEF COMPLAINTS/PURPOSE OF CONSULTATION: ANEMIA   HEMATOLOGY HISTORY:  # EASY BRUISING  HISTORY OF PRESENTING ILLNESS:  Marissa Quinn 66 y.o.  female has been referred to [place] for further evaluation/work-up for easy bruising.  Patient noted to have spontaneous bruising on the left side/medial knee.  Patient was evaluated emergency room for further work-up.  PT normal CBC CMP normal; x-ray of the knee no soft tissue swelling or effusion; ultrasound Dopplers negative for blood clot.   Patient has not had any further episodes.   Gum/Nose bleed: none Blood in stools: None Blood in urine: None Vaginal bleeding: None Liver disease: None Wisdom teeth extraction- in 40s--no bleeding Blood thinner- asprin Family History of bleeding: none Medications: celebrex- longtime   Review of Systems  Constitutional:  Negative for chills, diaphoresis, fever, malaise/fatigue and weight loss.  HENT:  Negative for nosebleeds and sore throat.   Eyes:  Negative for double vision.  Respiratory:  Negative for cough, hemoptysis, sputum production, shortness of breath and wheezing.   Cardiovascular:  Negative for chest pain, palpitations, orthopnea and leg swelling.  Gastrointestinal:  Negative for abdominal pain, blood in stool, constipation, diarrhea, heartburn, melena, nausea and vomiting.  Genitourinary:  Negative for dysuria, frequency and urgency.  Musculoskeletal:  Negative for back pain and joint pain.  Skin: Negative.  Negative for itching and rash.  Neurological:  Negative for dizziness, tingling, focal weakness, weakness and headaches.  Endo/Heme/Allergies:  Bruises/bleeds easily.  Psychiatric/Behavioral:  Negative for depression. The patient is not nervous/anxious and does not have insomnia.    MEDICAL HISTORY:  Past Medical History:  Diagnosis Date   Anemia    IDA    Anxiety    Arthritis    Asthma    Autism    Bilateral leg numbness    Bipolar disorder ([place])    Bronchitis    Depression    Difficult intubation    Environmental allergies    History of attempted suicide    Hypertension    Hypothyroidism    Obesity    morbid obesity 40.0-44.9   Pre-diabetes    Sleep apnea    Sleep apnea    Thyroid disease     SURGICAL HISTORY: Past Surgical History:  Procedure Laterality Date   ABDOMINAL HYSTERECTOMY     CARPAL TUNNEL RELEASE     CHOLECYSTECTOMY     COLONOSCOPY     COLONOSCOPY WITH PROPOFOL N/A 11/22/2017   Procedure: COLONOSCOPY WITH PROPOFOL;  Surgeon: Toledo, Benay Pike, MD;  Location: [facility];  Service: Gastroenterology;  Laterality: N/A;   COLONOSCOPY WITH PROPOFOL N/A 10/21/2020   Procedure: COLONOSCOPY WITH PROPOFOL;  Surgeon: Toledo, Benay Pike, MD;  Location: [facility];  Service: Gastroenterology;  Laterality: N/A;  EARLY AM D/T TRANSPORTATION   CYSTOSCOPY  05/04/2015   Procedure: CYSTOSCOPY;  Surgeon: Benjaman Kindler, MD;  Location: [facility] OR[facility];  Service: Gynecology;;   GALLBLADDER SURGERY     NECK SURGERY     OOPHORECTOMY     ROBOTIC ASSISTED LAPAROSCOPIC HYSTERECTOMY AND SALPINGECTOMY Bilateral 03/30/2015   Procedure: ROBOTIC ASSISTED LAPAROSCOPIC HYSTERECTOMY AND SALPINGECTOMY;  Surgeon: Benjaman Kindler, MD;  Location: [facility] OR[facility];  Service: Gynecology;  Laterality: Bilateral;   ROBOTIC ASSISTED TOTAL HYSTERECTOMY WITH SALPINGECTOMY Bilateral 05/04/2015   Procedure: ROBOTIC ASSISTED TOTAL HYSTERECTOMY WITH BILATERAL SALPINGOOPHERECTOMY;  Surgeon: Benjaman Kindler, MD;  Location: [facility];  Service: Gynecology;  Laterality: Bilateral;  THYROID SURGERY     THYROIDECTOMY     total    SOCIAL HISTORY: Social History   Socioeconomic History   Marital status: Divorced    Spouse name: Not on file   Number of children: Not on file   Years of education: Not on file   Highest education level: Not on file  Occupational History    Not on file  Tobacco Use   Smoking status: Never   Smokeless tobacco: Never  Vaping Use   Vaping Use: Never used  Substance and Sexual Activity   Alcohol use: No    Alcohol/week: 0.0 standard drinks    Comment: Occasionally    Drug use: No   Sexual activity: Never    Birth control/protection: Surgical  Other Topics Concern   Not on file  Social History Narrative   Lives in [place]; lives by self; daughter in [place] [med student] no smoking; no alcohol. 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 Autism.     Social Determinants of Health   Financial Resource Strain: Not on file  Food Insecurity: Not on file  Transportation Needs: Not on file  Physical Activity: Not on file  Stress: Not on file  Social Connections: Not on file  Intimate Partner Violence: Not on file    FAMILY HISTORY: Family History  Problem Relation Age of Onset   Brain cancer Mother    Dementia Father    Alcohol abuse Father    Depression Father    Breast cancer Sister 79       half sister paternal side   Bladder Cancer Sister    Hypertension Brother    Depression Paternal Grandmother    Depression Daughter     ALLERGIES:  is allergic to haloperidol, risperidone, demeclocycline, haloperidol lactate, lansoprazole, nitrofurantoin, nitrofurantoin monohyd macro, risperidone and related, tetracycline, and tetracyclines & related.  MEDICATIONS:  Current Outpatient Medications  Medication Sig Dispense Refill   albuterol (PROVENTIL HFA;VENTOLIN HFA) 108 (90 BASE) MCG/ACT inhaler Inhale 1 puff into the lungs every 4 (four) hours as needed.      Ascorbic Acid (VITAMIN C ADULT GUMMIES PO) Take by mouth.     aspirin EC 81 MG tablet Take by mouth.     atorvastatin (LIPITOR) 40 MG tablet Take 1 tablet by mouth daily.     buPROPion (WELLBUTRIN SR) 150 MG 12 hr tablet Take 300 mg by mouth daily.     Calcium Carbonate (CALCIUM 500 PO) Take by mouth.     celecoxib (CELEBREX) 100 MG capsule TAKE 1  CAPSULE(100 MG) BY MOUTH TWICE DAILY     Cholecalciferol 1000 units TBDP Take 1,000 Units by mouth daily.     citalopram (CELEXA) 20 MG tablet Take 20 mg by mouth at bedtime.     Cyanocobalamin (VITAMIN B-12 PO) Take by mouth.     fluticasone (FLONASE) 50 MCG/ACT nasal spray Place into the nose.     hydrochlorothiazide (HYDRODIURIL) 12.5 MG tablet Take 12.5 mg by mouth daily.   1   levocetirizine (XYZAL) 5 MG tablet Take 5 mg by mouth at bedtime.     levothyroxine (SYNTHROID) 88 MCG tablet Take by mouth.     Multiple Vitamin (MULTI-VITAMINS) TABS Take 1 tablet by mouth as needed.      ARIPiprazole (ABILIFY) 20 MG tablet Take 1 tablet (20 mg total) by mouth daily. 30 tablet 1   spironolactone (ALDACTONE) 25 MG tablet Take 1 tablet by mouth daily.     No current  facility-administered medications for this visit.    PHYSICAL EXAMINATION:  Left knee medial aspect-bruise noted/color seems to be clearing up Vitals:   03/17/21 1409  BP: (!) 146/88  Pulse: 62  Temp: (!) 96.9 F (36.1 C)  SpO2: 100%   Filed Weights   03/17/21 1409  Weight: 226 lb 6.4 oz (102.7 kg)    Physical Exam Vitals and nursing note reviewed.  HENT:     Head: Normocephalic and atraumatic.     Mouth/Throat:     Pharynx: Oropharynx is clear.  Eyes:     Extraocular Movements: Extraocular movements intact.     Pupils: Pupils are equal, round, and reactive to light.  Cardiovascular:     Rate and Rhythm: Normal rate and regular rhythm.  Pulmonary:     Comments: Decreased breath sounds bilaterally.  Abdominal:     Palpations: Abdomen is soft.  Musculoskeletal:        General: Normal range of motion.     Cervical back: Normal range of motion.  Skin:    General: Skin is warm.  Neurological:     General: No focal deficit present.     Mental Status: She is alert and oriented to person, place, and time.  Psychiatric:        Behavior: Behavior normal.        Judgment: Judgment normal.    LABORATORY DATA:  I  have reviewed the data as listed Lab Results  Component Value Date   WBC 6.3 03/11/2021   HGB 12.1 03/11/2021   HCT 37.0 03/11/2021   MCV 96.1 03/11/2021   PLT 243 03/11/2021   Recent Labs    03/11/21 0041  NA 139  K 4.0  CL 107  CO2 27  GLUCOSE 98  BUN 20  CREATININE 0.97  CALCIUM 9.1  GFRNONAA >60     US Venous Img Lower Unilateral Left  Result Date: 03/11/2021 CLINICAL DATA:  Left leg pain x1 day EXAM: LEFT LOWER EXTREMITY VENOUS DOPPLER ULTRASOUND TECHNIQUE: Gray-scale sonography with compression, as well as color and duplex ultrasound, were performed to evaluate the deep venous system(s) from the level of the common femoral vein through the popliteal and proximal calf veins. COMPARISON:  None. FINDINGS: VENOUS Normal compressibility of the common femoral, superficial femoral, and popliteal veins, as well as the visualized calf veins. Visualized portions of profunda femoral vein and great saphenous vein unremarkable. No filling defects to suggest DVT on grayscale or color Doppler imaging. Doppler waveforms show normal direction of venous flow, normal respiratory plasticity and response to augmentation. Limited views of the contralateral common femoral vein are unremarkable. OTHER None. Limitations: none IMPRESSION: Negative. Electronically Signed   By: Julian Hy [name]   On: 03/11/2021 00:18   DG Knee Complete 4 Views Left  Result Date: 03/11/2021 CLINICAL DATA:  Bruising. EXAM: LEFT KNEE - COMPLETE 4+ VIEW COMPARISON:  None. FINDINGS: There is no acute fracture or dislocation. Tiny bone fragment along the intercondylar notch, likely chronic. Mild arthritic changes and narrowing of the patellofemoral compartment. No significant joint effusion. The soft tissues are unremarkable. IMPRESSION: 1. No acute fracture or dislocation. 2. Mild arthritic changes. Electronically Signed   By: Anner Crete [name]   On: 03/11/2021 01:07    Abnormal bruising # Bruise on Left medial knee-  one episode/spontaneous-  Do not suspect any congenital or familial cause of bleeding diathesis given absence of patient's previous bleeding history. FEB 2023-repeat PT PTT normal.  Suspect Celebrex as a possibility for  bruising.  Recommend limiting Celebrex/see below  # Bil Knee pain- on celebrex 100 BID [2-3 years; PCP]; recommend follow-up with Dr. Ouida Sills regarding cutting down the frequency of Celebrex to once a day; and take Tylenol as needed.   Thank you Dr.Sung for allowing me to participate in the care of your pleasant patient. Please do not hesitate to contact me with questions or concerns in the interim.  # DISPOSITION: # labs today-  PT/PTT # follow up TBD- Dr.B  Cc; Dr.Anderson   All questions were answered. The patient knows to call the clinic with any problems, questions or concerns.    Cammie Sickle, MD 03/17/2021 4:08 PM

## 2021-03-25 ENCOUNTER — Ambulatory Visit (INDEPENDENT_AMBULATORY_CARE_PROVIDER_SITE_OTHER): Payer: Medicare Other | Admitting: Psychology

## 2021-03-25 DIAGNOSIS — F509 Eating disorder, unspecified: Secondary | ICD-10-CM

## 2021-03-31 ENCOUNTER — Telehealth: Payer: Self-pay | Admitting: Psychology

## 2021-03-31 NOTE — Telephone Encounter (Signed)
This Probation officer spoke with Marissa Quinn's identified therapist, Dr. Peggye Ley. He noted he has "no issues" regarding her pursuit of bariatric surgery, adding she has processed her concerns about surgical "complications" with him. He stated a plan to send this Probation officer a letter with information about her mental health treatment.  ?

## 2021-03-31 NOTE — Telephone Encounter (Addendum)
This Probation officer contacted Marissa Quinn's identified mental health facility to obtain information about her mental health treatment to add to her bariatric evaluation (a ROI was previously completed). The call was not answered. A HIPAA-compliant voicemail was left.  ?

## 2021-04-08 ENCOUNTER — Ambulatory Visit (INDEPENDENT_AMBULATORY_CARE_PROVIDER_SITE_OTHER): Payer: Medicare Other | Admitting: Psychology

## 2021-04-08 DIAGNOSIS — F509 Eating disorder, unspecified: Secondary | ICD-10-CM
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
## 2021-04-27 ENCOUNTER — Other Ambulatory Visit: Payer: Self-pay | Admitting: Family Medicine

## 2021-04-27 ENCOUNTER — Ambulatory Visit
Admission: RE | Admit: 2021-04-27 | Discharge: 2021-04-27 | Disposition: A | Discharge status: Home / Self Care | Payer: Medicare Other | Source: Ambulatory Visit | Attending: Family Medicine | Admitting: Family Medicine

## 2021-04-27 DIAGNOSIS — R1031 Right lower quadrant pain: Secondary | ICD-10-CM | POA: Insufficient documentation

## 2021-04-27 DIAGNOSIS — K625 Hemorrhage of anus and rectum: Secondary | ICD-10-CM

## 2021-04-27 LAB — POCT I-STAT CREATININE: Creatinine, Ser: 1.1 mg/dL — ABNORMAL HIGH (ref 0.44–1.00)

## 2021-04-27 MED ORDER — IOHEXOL 300 MG/ML  SOLN
100.0000 mL | Freq: Once | INTRAMUSCULAR | Status: AC | PRN
  Administered 2021-04-27: 100 mL via INTRAVENOUS

## 2021-05-31 ENCOUNTER — Ambulatory Visit
Admission: RE | Admit: 2021-05-31 | Discharge: 2021-05-31 | Disposition: A | Discharge status: Home / Self Care | Payer: Medicare Other | Source: Ambulatory Visit | Attending: Internal Medicine | Admitting: Internal Medicine

## 2021-05-31 DIAGNOSIS — Z1231 Encounter for screening mammogram for malignant neoplasm of breast: Secondary | ICD-10-CM

## 2021-08-21 ENCOUNTER — Emergency Department
Admission: EM | Admit: 2021-08-21 | Discharge: 2021-08-21 | Disposition: A | Discharge status: Home / Self Care | Payer: Medicare Other | Attending: Emergency Medicine | Admitting: Emergency Medicine

## 2021-08-21 ENCOUNTER — Other Ambulatory Visit: Payer: Self-pay

## 2021-08-21 DIAGNOSIS — I10 Essential (primary) hypertension: Secondary | ICD-10-CM | POA: Diagnosis not present

## 2021-08-21 DIAGNOSIS — D72829 Elevated white blood cell count, unspecified: Secondary | ICD-10-CM | POA: Insufficient documentation

## 2021-08-21 DIAGNOSIS — E039 Hypothyroidism, unspecified: Secondary | ICD-10-CM | POA: Insufficient documentation

## 2021-08-21 DIAGNOSIS — F840 Autistic disorder: Secondary | ICD-10-CM | POA: Diagnosis not present

## 2021-08-21 DIAGNOSIS — J45909 Unspecified asthma, uncomplicated: Secondary | ICD-10-CM | POA: Insufficient documentation

## 2021-08-21 DIAGNOSIS — R112 Nausea with vomiting, unspecified: Secondary | ICD-10-CM | POA: Diagnosis present

## 2021-08-21 DIAGNOSIS — R1013 Epigastric pain: Secondary | ICD-10-CM

## 2021-08-21 LAB — URINALYSIS, COMPLETE (UACMP) WITH MICROSCOPIC
Bilirubin Urine: NEGATIVE
Glucose, UA: NEGATIVE mg/dL
Ketones, ur: NEGATIVE mg/dL
Leukocytes,Ua: NEGATIVE
Nitrite: NEGATIVE
Protein, ur: 30 mg/dL — AB
Specific Gravity, Urine: 1.025 (ref 1.005–1.030)
pH: 5 (ref 5.0–8.0)

## 2021-08-21 LAB — CBC WITH DIFFERENTIAL/PLATELET
Abs Immature Granulocytes: 0.05 10*3/uL (ref 0.00–0.07)
Basophils Absolute: 0 10*3/uL (ref 0.0–0.1)
Basophils Relative: 0 %
Eosinophils Absolute: 0.1 10*3/uL (ref 0.0–0.5)
Eosinophils Relative: 1 %
HCT: 40.9 % (ref 36.0–46.0)
Hemoglobin: 13.2 g/dL (ref 12.0–15.0)
Immature Granulocytes: 0 %
Lymphocytes Relative: 7 %
Lymphs Abs: 0.8 10*3/uL (ref 0.7–4.0)
MCH: 31 pg (ref 26.0–34.0)
MCHC: 32.3 g/dL (ref 30.0–36.0)
MCV: 96 fL (ref 80.0–100.0)
Monocytes Absolute: 0.6 10*3/uL (ref 0.1–1.0)
Monocytes Relative: 5 %
Neutro Abs: 10.8 10*3/uL — ABNORMAL HIGH (ref 1.7–7.7)
Neutrophils Relative %: 87 %
Platelets: 245 10*3/uL (ref 150–400)
RBC: 4.26 MIL/uL (ref 3.87–5.11)
RDW: 13.3 % (ref 11.5–15.5)
WBC: 12.4 10*3/uL — ABNORMAL HIGH (ref 4.0–10.5)
nRBC: 0 % (ref 0.0–0.2)

## 2021-08-21 LAB — COMPREHENSIVE METABOLIC PANEL
ALT: 17 U/L (ref 0–44)
AST: 25 U/L (ref 15–41)
Albumin: 4.5 g/dL (ref 3.5–5.0)
Alkaline Phosphatase: 84 U/L (ref 38–126)
Anion gap: 9 (ref 5–15)
BUN: 17 mg/dL (ref 8–23)
CO2: 22 mmol/L (ref 22–32)
Calcium: 9.1 mg/dL (ref 8.9–10.3)
Chloride: 108 mmol/L (ref 98–111)
Creatinine, Ser: 0.86 mg/dL (ref 0.44–1.00)
GFR, Estimated: >60 mL/min (ref >60)
Glucose, Bld: 76 mg/dL (ref 70–99)
Potassium: 4.4 mmol/L (ref 3.5–5.1)
Sodium: 139 mmol/L (ref 135–145)
Total Bilirubin: 0.6 mg/dL (ref 0.3–1.2)
Total Protein: 7.7 g/dL (ref 6.5–8.1)

## 2021-08-21 LAB — TROPONIN I (HIGH SENSITIVITY): Troponin I (High Sensitivity): 6 ng/L (ref <18)

## 2021-08-21 LAB — LIPASE, BLOOD: Lipase: 46 U/L (ref 11–51)

## 2021-08-21 MED ORDER — ALUM & MAG HYDROXIDE-SIMETH 200-200-20 MG/5ML PO SUSP
30.0000 mL | Freq: Once | ORAL | Status: AC
  Administered 2021-08-21: 30 mL via ORAL
  Filled 2021-08-21: qty 30

## 2021-08-21 MED ORDER — ONDANSETRON HCL 4 MG PO TABS: 4.0000 mg | ORAL_TABLET | Freq: Three times a day (TID) | ORAL | 0 refills | Status: AC | PRN

## 2021-08-21 MED ORDER — ONDANSETRON 4 MG PO TBDP
4.0000 mg | ORAL_TABLET | Freq: Once | ORAL | Status: AC
  Administered 2021-08-21: 4 mg via ORAL
  Filled 2021-08-21: qty 1

## 2021-08-21 MED ORDER — SUCRALFATE 1 G PO TABS
1.0000 g | ORAL_TABLET | Freq: Once | ORAL | Status: AC
  Administered 2021-08-21: 1 g via ORAL
  Filled 2021-08-21: qty 1

## 2021-08-21 NOTE — ED Triage Notes (Signed)
C/o umbilical abd pain since this morning. 4-5 episodes of vomiting.

## 2021-08-21 NOTE — ED Provider Notes (Addendum)
[facility] Provider Note    Event Date/Time   First MD Initiated Contact with Patient 08/21/21 1329     (approximate)   History   Abdominal Pain   HPI  Marissa Quinn is a 66 y.o. female with a past medical history of thyroid disease, OSA, bipolar disorder, asthma, arthritis, anxiety, anemia and autism who presents for evaluation of some epigastric comfort associate with nonbloody nonbilious vomiting that started this afternoon.  Patient states she had some tomatoes for lunch.  She states she is feeling much better now.  She denies any chest pain, cough, shortness of breath, fevers, diarrhea, constipation, urinary symptoms, back pain.  She denies any significant NSAID use, EtOH use or illicit drug use.  No recent travel outside [place].  She has no other acute concerns at this time.    Past Medical History:  Diagnosis Date   Anemia    IDA   Anxiety    Arthritis    Asthma    Autism    Bilateral leg numbness    Bipolar disorder ([place])    Bronchitis    Depression    Difficult intubation    Environmental allergies    History of attempted suicide    Hypertension    Hypothyroidism    Obesity    morbid obesity 40.0-44.9   Pre-diabetes    Sleep apnea    Sleep apnea    Thyroid disease      Physical Exam  Triage Vital Signs: ED Triage Vitals  Enc Vitals Group     BP 08/21/21 1323 134/76     Pulse Rate 08/21/21 1323 62     Resp 08/21/21 1323 18     Temp 08/21/21 1323 98.3 F (36.8 C)     Temp Source 08/21/21 1323 Oral     SpO2 08/21/21 1323 95 %     Weight 08/21/21 1324 225 lb (102.1 kg)     Height 08/21/21 1324 '5\' 3"'$  (1.6 m)     Head Circumference --      Peak Flow --      Pain Score 08/21/21 1324 8     Pain Loc --      Pain Edu? --      Excl. in [place]? --     Most recent vital signs: Vitals:   08/21/21 1323 08/21/21 1454  BP: 134/76 140/70  Pulse: 62 68  Resp: 18 13  Temp: 98.3 F (36.8 C) 98.1 F (36.7 C)  SpO2: 95% 96%     General: Awake, no distress.  CV:  Good peripheral perfusion.  2+ radial pulses.  No significant murmur. Resp:  Normal effort.  Clear bilaterally Abd:  No distention.  Mild tenderness in epigastrium left upper quadrant.  Otherwise soft throughout.  No CVA tenderness. Other:     ED Results / Procedures / Treatments  Labs (all labs ordered are listed, but only abnormal results are displayed) Labs Reviewed  URINALYSIS, COMPLETE (UACMP) WITH MICROSCOPIC - Abnormal; Notable for the following components:      Result Value   Color, Urine YELLOW (*)    APPearance HAZY (*)    Hgb urine dipstick SMALL (*)    Protein, ur 30 (*)    Bacteria, UA RARE (*)    All other components within normal limits  CBC WITH DIFFERENTIAL/PLATELET - Abnormal; Notable for the following components:   WBC 12.4 (*)    Neutro Abs 10.8 (*)    All other components  within normal limits  COMPREHENSIVE METABOLIC PANEL  LIPASE, BLOOD  TROPONIN I (HIGH SENSITIVITY)     EKG  EKG is remarkable for sinus rhythm with ventricular rate of 63, normal axis with some artifact in lead I and lead III without any other clear evidence of acute ischemia or significant arrhythmia.   RADIOLOGY   PROCEDURES:  Critical Care performed: No  .1-3 Lead EKG Interpretation  Performed by: Lucrezia Starch, MD Authorized by: Lucrezia Starch, MD     Interpretation: normal     ECG rate assessment: normal     Rhythm: sinus rhythm     Ectopy: none     Conduction: normal    The patient is on the cardiac monitor to evaluate for evidence of arrhythmia and/or significant heart rate changes.   MEDICATIONS ORDERED IN ED: Medications  alum & mag hydroxide-simeth (MAALOX/MYLANTA) 200-200-20 MG/5ML suspension 30 mL (30 mLs Oral Given 08/21/21 1401)  sucralfate (CARAFATE) tablet 1 g (1 g Oral Given 08/21/21 1401)  ondansetron (ZOFRAN-ODT) disintegrating tablet 4 mg (4 mg Oral Given 08/21/21 1402)     IMPRESSION / MDM / ASSESSMENT  AND PLAN / ED COURSE  I reviewed the triage vital signs and the nursing notes. Patient's presentation is most consistent with acute presentation with potential threat to life or bodily function.                               Differential diagnosis includes, but is not limited to gastritis, peptic ulcer disease, GERD, pancreatitis, anginal equivalent.  She has no lower abdominal tenderness or any ongoing GI symptoms to suggest diverticulitis, acute hepatitis, pneumonia, PE or dissection or AAA.  No lower abdominal symptoms or back pain to suggest stone or urinary infection.  Addition her UA does not appear infected.  Patient is status post cholecystectomy.  EKG is remarkable for sinus rhythm with ventricular rate of 63, normal axis with some artifact in lead I and lead III without any other clear evidence of acute ischemia or significant arrhythmia.  CBC with WBC count of 12.4 with normal hemoglobin and platelets.  Lipase is WNL and not suggestive of pancreatitis.  CMP shows no significant electrolyte or metabolic derangements.  No evidence of hepatitis or cholestatic process.  Care patient signed over to assuming provider approximately 1530 with plan to follow-up remaining labs and reassess..  In the meantime we will give some GI cocktail to Protonix to see if this helps and p.o. challenge patient.  On my reassessment patient is feeling much better.  She is able tolerate p.o. her troponin is negative.  I suspect some gastritis or reflux.  Given low suspicion for other immediate left-sided process patient feeling better and tolerating p.o. with reassuring work-up and exam I feel she is appropriate for discharge with outpatient follow-up.  Discharged in stable condition.  Strict and precautions advised and discussed.     FINAL CLINICAL IMPRESSION(S) / ED DIAGNOSES   Final diagnoses:  Nausea and vomiting, unspecified vomiting type  Epigastric pain     Rx / DC Orders   ED Discharge Orders      None        Note:  This document was prepared using Dragon voice recognition software and may include unintentional dictation errors.   Lucrezia Starch, MD 08/21/21 1536    Lucrezia Starch, MD 08/21/21 [phone]

## 2021-08-21 NOTE — ED Notes (Signed)
Iv unable to draw back, multiple attempts for labs, lab called wanted a recollect on green tube , asked for lab to send a tech due to difficult stick

## 2021-09-27 IMAGING — MR MR HEAD WO/W CM
14 series · 48 of 48 positions shown · IV contrast (gadavist)
Comparison: 12/02/2013

CLINICAL DATA: Mixed action and resting tremor for 1 year,
worsening

EXAM:
MRI HEAD WITHOUT AND WITH CONTRAST
TECHNIQUE: Multiplanar, multiecho pulse sequences of the brain and surrounding
structures were obtained without and with intravenous contrast.
CONTRAST:  10mL GADAVIST GADOBUTROL 1 MMOL/ML IV SOLN

[Series 5: ax dwi_tracew · axial · 3.0mm · 0.60mm/px · z∈[-100,+54]mm · 3 of 48 slices shown]
[im 1/48]
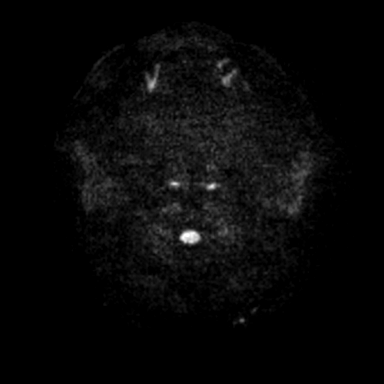
[im 24/48]
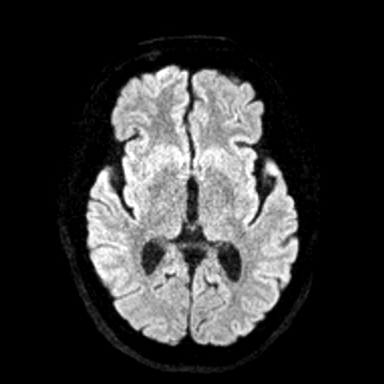
[im 48/48]
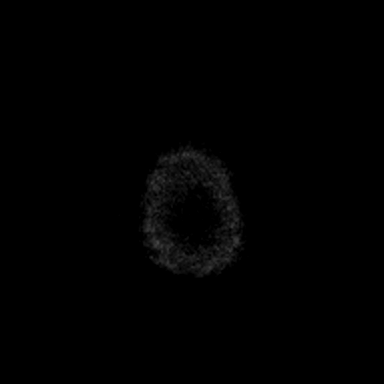

[Series 6: ax dwi_adc · axial · 3.0mm · 0.60mm/px · z∈[-100,+54]mm · 3 of 48 slices shown]
[im 1/48]
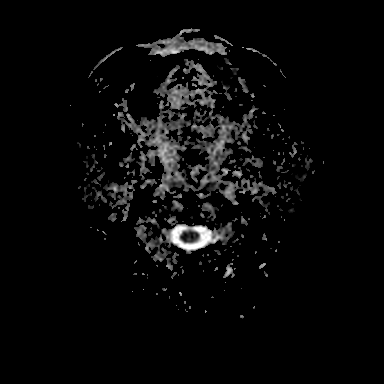
[im 24/48]
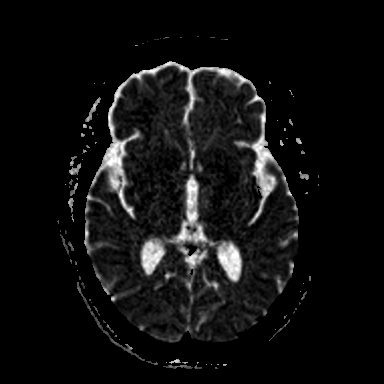
[im 48/48]
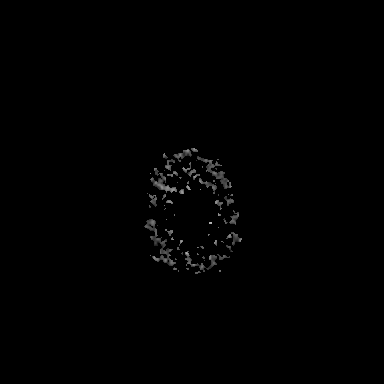

[Series 7: cor dwi_tracew · coronal · 5.0mm · 0.60mm/px · 2 of 40 slices shown]
[im 1/40]
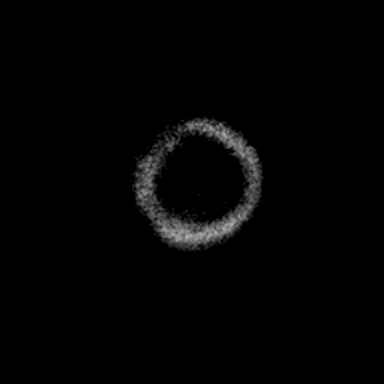
[im 40/40]
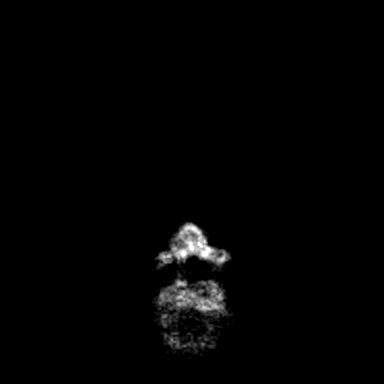

[Series 8: cor dwi_adc · coronal · 5.0mm · 0.60mm/px · 2 of 40 slices shown]
[im 1/40]
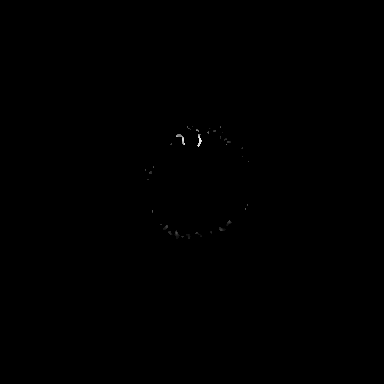
[im 40/40]
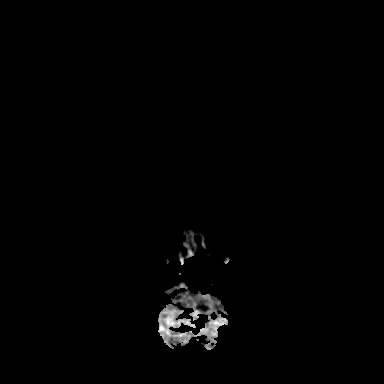

[Series 9: T1 · sagittal · 5.0mm · 0.62mm/px · 1 of 21 slices shown (1 of 2)]
[im 1/21]
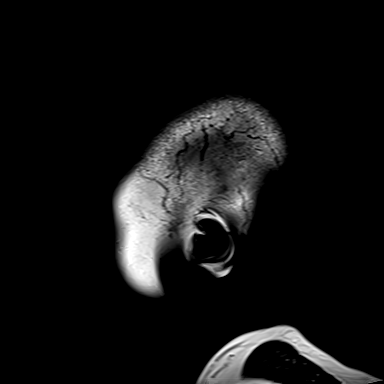

[Series 10: T2 · axial · 5.0mm · 0.53mm/px · 1 of 25 slices shown]
[im 1/25]
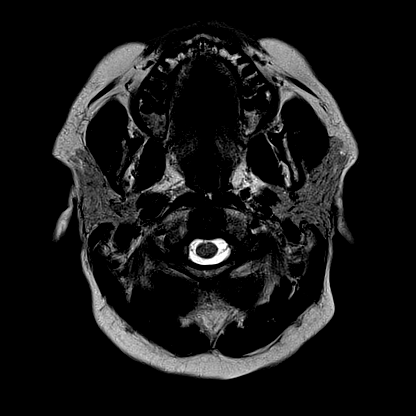

[Series 11: mag_images · axial · 3.0mm · 0.90mm/px · z∈[-112,+64]mm · 3 of 60 slices shown]
[im 1/60]
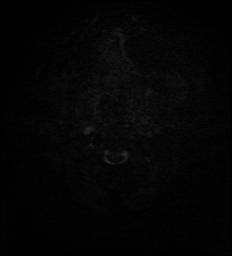
[im 30/60]
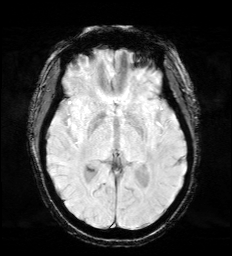
[im 60/60]
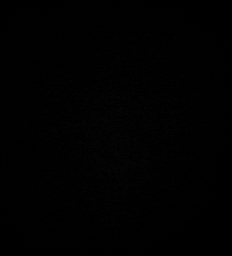

[Series 12: pha_images · axial · 3.0mm · 0.90mm/px · z∈[-109,+61]mm · 3 of 58 slices shown]
[im 1/58]
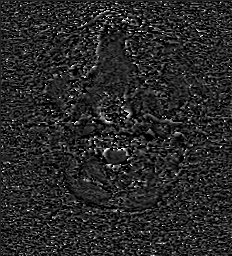
[im 29/58]
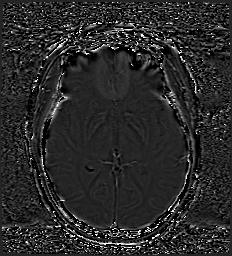
[im 58/58]
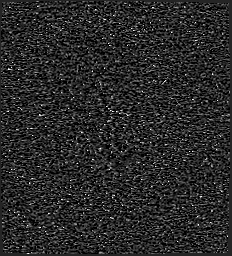

[Series 13: swi_images · axial · 3.0mm · 0.90mm/px · z∈[-112,+64]mm · 3 of 60 slices shown]
[im 1/60]
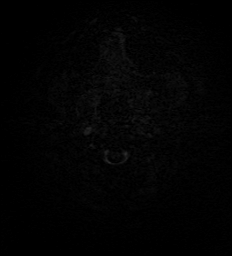
[im 30/60]
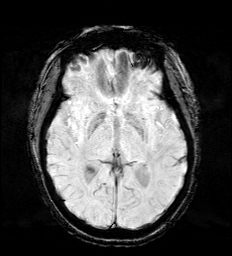
[im 60/60]
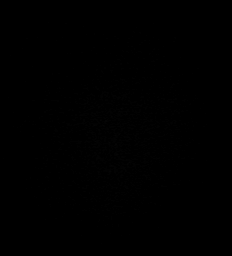

[Series 15: FLAIR · axial · 3.0mm · 0.53mm/px · z∈[-104,+56]mm · 3 of 55 slices shown]
[im 1/55]
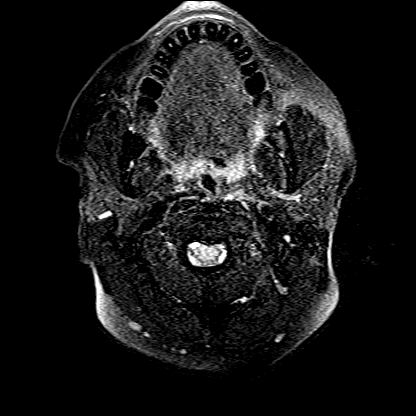
[im 28/55]
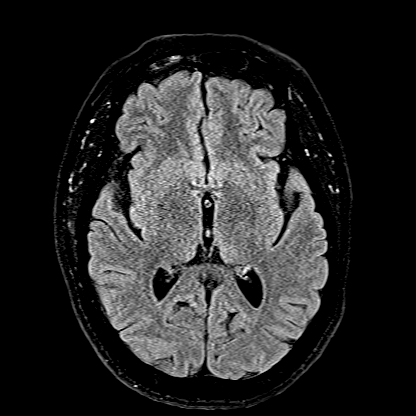
[im 55/55]
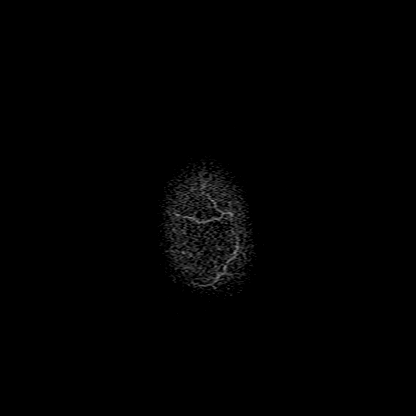

[Series 16: T1 · axial · 1.0mm · 0.98mm/px · z∈[-105,+68]mm · 10 of 176 slices shown (2 of 2)]
[im 1/176]
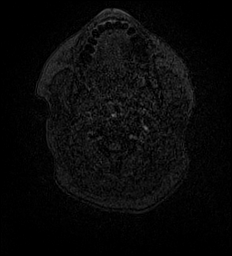
[im 20/176]
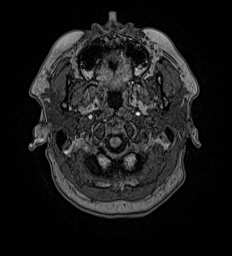
[im 39/176]
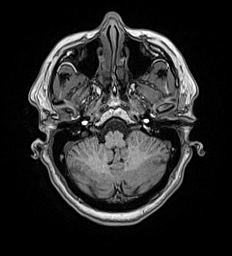
[im 59/176]
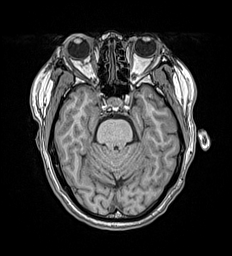
[im 78/176]
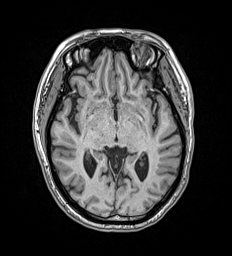
[im 98/176]
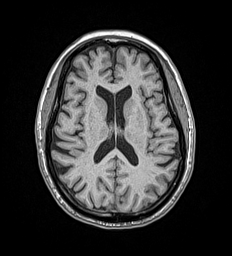
[im 117/176]
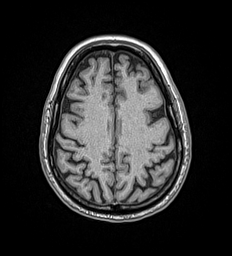
[im 137/176]
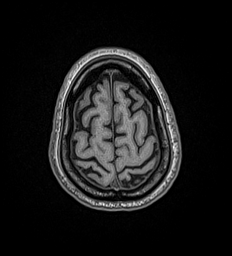
[im 156/176]
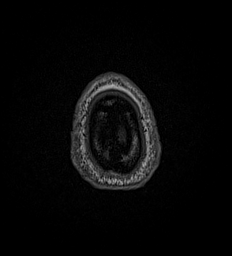
[im 176/176]
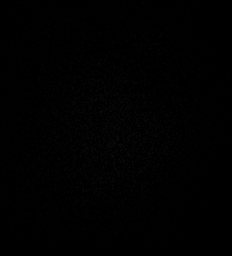

[Series 17: T2 post-contrast · coronal · 5.0mm · 0.57mm/px · 2 of 29 slices shown]
[im 1/29]
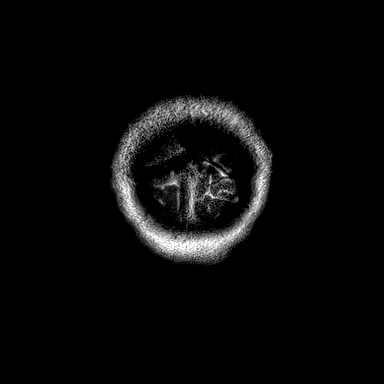
[im 29/29]
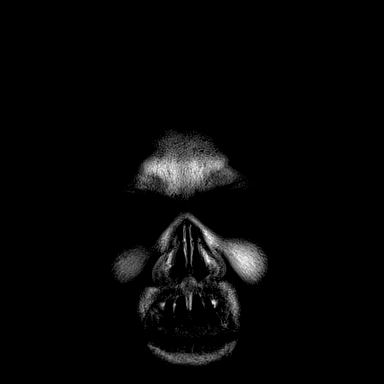

[Series 18: T1 post-contrast · axial · 1.0mm · 0.98mm/px · z∈[-105,+68]mm · 10 of 176 slices shown (1 of 2)]
[im 1/176]
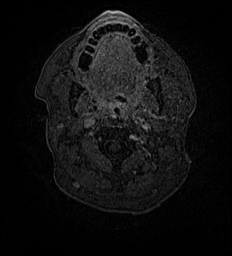
[im 20/176]
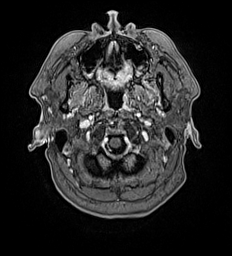
[im 39/176]
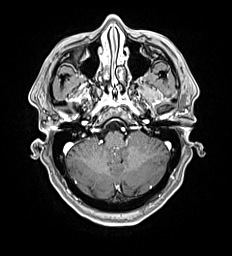
[im 59/176]
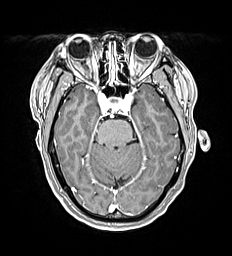
[im 78/176]
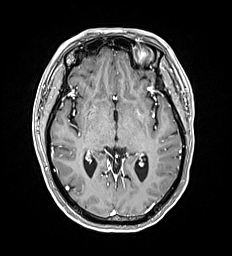
[im 98/176]
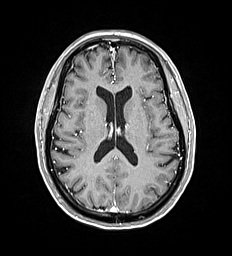
[im 117/176]
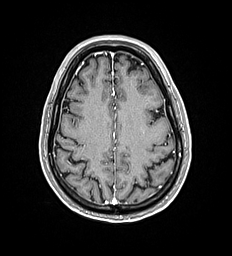
[im 137/176]
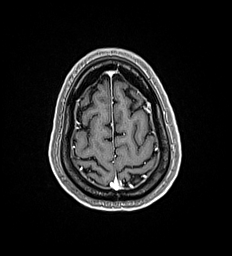
[im 156/176]
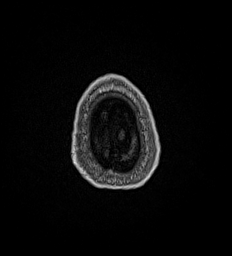
[im 176/176]
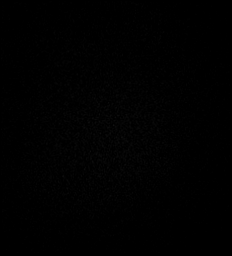

[Series 19: T1 post-contrast · coronal · 5.0mm · 0.57mm/px · 2 of 29 slices shown (2 of 2)]
[im 1/29]
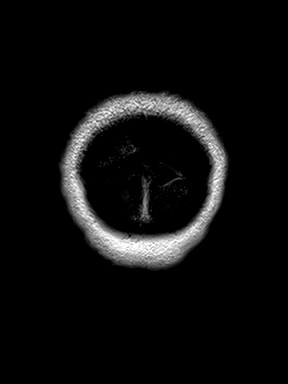
[im 29/29]
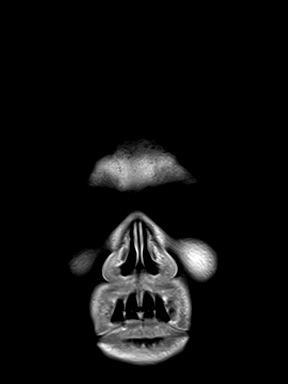

[48 of 48 positions shown; findings below may reference images not displayed]

FINDINGS: Brain: No white matter disease, abnormal mineralization, infarction,
hemorrhage, hydrocephalus, extra-axial collection or mass lesion. No
abnormal intracranial enhancement.

Vascular: Normal flow voids and vascular enhancements.

Skull and upper cervical spine: Normal marrow signal.

Sinuses/Orbits: Negative
IMPRESSION: Normal MRI of the brain.

## 2022-03-31 ENCOUNTER — Other Ambulatory Visit: Payer: Self-pay | Admitting: Internal Medicine

## 2022-03-31 DIAGNOSIS — Z1231 Encounter for screening mammogram for malignant neoplasm of breast: Secondary | ICD-10-CM

## 2022-06-09 ENCOUNTER — Ambulatory Visit
Admission: RE | Admit: 2022-06-09 | Discharge: 2022-06-09 | Disposition: A | Discharge status: Home / Self Care | Payer: Medicare Other | Source: Ambulatory Visit | Attending: Internal Medicine | Admitting: Internal Medicine

## 2022-06-09 DIAGNOSIS — Z1231 Encounter for screening mammogram for malignant neoplasm of breast: Secondary | ICD-10-CM

## 2023-03-31 ENCOUNTER — Other Ambulatory Visit: Payer: Self-pay | Admitting: Internal Medicine

## 2023-03-31 DIAGNOSIS — Z1231 Encounter for screening mammogram for malignant neoplasm of breast: Secondary | ICD-10-CM

## 2023-06-14 ENCOUNTER — Ambulatory Visit
Admission: RE | Admit: 2023-06-14 | Discharge: 2023-06-14 | Disposition: A | Discharge status: Home / Self Care | Source: Ambulatory Visit | Attending: Internal Medicine | Admitting: Internal Medicine

## 2023-06-14 DIAGNOSIS — Z1231 Encounter for screening mammogram for malignant neoplasm of breast: Secondary | ICD-10-CM | POA: Diagnosis present
